# Patient Record
Sex: Female | Born: 1940 | Race: White | Hispanic: No
Health system: Southern US, Community
[De-identification: ages and names within clinical notes are randomized; demographics above are authoritative.]

## PROBLEM LIST (undated history)

## (undated) DIAGNOSIS — J309 Allergic rhinitis, unspecified: Secondary | ICD-10-CM

## (undated) DIAGNOSIS — Z8673 Personal history of transient ischemic attack (TIA), and cerebral infarction without residual deficits: Secondary | ICD-10-CM

## (undated) DIAGNOSIS — F411 Generalized anxiety disorder: Secondary | ICD-10-CM

## (undated) DIAGNOSIS — Z515 Encounter for palliative care: Secondary | ICD-10-CM

## (undated) DIAGNOSIS — Z87440 Personal history of urinary (tract) infections: Secondary | ICD-10-CM

## (undated) DIAGNOSIS — R32 Unspecified urinary incontinence: Secondary | ICD-10-CM

## (undated) DIAGNOSIS — E559 Vitamin D deficiency, unspecified: Secondary | ICD-10-CM

## (undated) DIAGNOSIS — Z8709 Personal history of other diseases of the respiratory system: Secondary | ICD-10-CM

## (undated) DIAGNOSIS — F32A Depression, unspecified: Secondary | ICD-10-CM

## (undated) DIAGNOSIS — M810 Age-related osteoporosis without current pathological fracture: Secondary | ICD-10-CM

## (undated) DIAGNOSIS — I10 Essential (primary) hypertension: Secondary | ICD-10-CM

## (undated) DIAGNOSIS — E039 Hypothyroidism, unspecified: Secondary | ICD-10-CM

## (undated) DIAGNOSIS — G2581 Restless legs syndrome: Secondary | ICD-10-CM

## (undated) DIAGNOSIS — E785 Hyperlipidemia, unspecified: Secondary | ICD-10-CM

## (undated) DIAGNOSIS — F0390 Unspecified dementia without behavioral disturbance: Secondary | ICD-10-CM

## (undated) DIAGNOSIS — F329 Major depressive disorder, single episode, unspecified: Secondary | ICD-10-CM

## (undated) DIAGNOSIS — Z66 Do not resuscitate: Secondary | ICD-10-CM

## (undated) HISTORY — DX: Do not resuscitate: Z66

## (undated) HISTORY — DX: Vitamin D deficiency, unspecified: E55.9

## (undated) HISTORY — DX: Personal history of transient ischemic attack (TIA), and cerebral infarction without residual deficits: Z86.73

## (undated) HISTORY — DX: Allergic rhinitis, unspecified: J30.9

## (undated) HISTORY — DX: Hyperlipidemia, unspecified: E78.5

## (undated) HISTORY — DX: Personal history of urinary (tract) infections: Z87.440

## (undated) HISTORY — DX: Personal history of other diseases of the respiratory system: Z87.09

## (undated) HISTORY — DX: Encounter for palliative care: Z51.5

## (undated) HISTORY — DX: Hypothyroidism, unspecified: E03.9

## (undated) HISTORY — DX: Restless legs syndrome: G25.81

## (undated) HISTORY — DX: Generalized anxiety disorder: F41.1

## (undated) HISTORY — DX: Major depressive disorder, single episode, unspecified: F32.9

## (undated) HISTORY — DX: Unspecified urinary incontinence: R32

## (undated) HISTORY — DX: Depression, unspecified: F32.A

---

## 1997-09-06 ENCOUNTER — Other Ambulatory Visit: Admission: RE | Admit: 1997-09-06 | Discharge: 1997-09-06 | Payer: Self-pay | Admitting: Obstetrics and Gynecology

## 1998-11-28 ENCOUNTER — Other Ambulatory Visit: Admission: RE | Admit: 1998-11-28 | Discharge: 1998-11-28 | Payer: Self-pay | Admitting: Obstetrics and Gynecology

## 2000-01-01 ENCOUNTER — Other Ambulatory Visit: Admission: RE | Admit: 2000-01-01 | Discharge: 2000-01-01 | Payer: Self-pay | Admitting: Obstetrics and Gynecology

## 2001-01-13 ENCOUNTER — Other Ambulatory Visit: Admission: RE | Admit: 2001-01-13 | Discharge: 2001-01-13 | Payer: Self-pay | Admitting: Obstetrics and Gynecology

## 2003-10-25 ENCOUNTER — Ambulatory Visit (HOSPITAL_COMMUNITY): Admission: RE | Admit: 2003-10-25 | Discharge: 2003-10-25 | Payer: Self-pay | Admitting: Internal Medicine

## 2005-02-06 ENCOUNTER — Emergency Department (HOSPITAL_COMMUNITY): Admission: EM | Admit: 2005-02-06 | Discharge: 2005-02-06 | Payer: Self-pay | Admitting: Emergency Medicine

## 2005-02-12 ENCOUNTER — Emergency Department (HOSPITAL_COMMUNITY): Admission: EM | Admit: 2005-02-12 | Discharge: 2005-02-12 | Payer: Self-pay | Admitting: Emergency Medicine

## 2005-02-13 ENCOUNTER — Ambulatory Visit: Admission: RE | Admit: 2005-02-13 | Discharge: 2005-02-13 | Payer: Self-pay | Admitting: Family Medicine

## 2008-05-07 HISTORY — PX: OTHER SURGICAL HISTORY: SHX169

## 2008-07-13 ENCOUNTER — Ambulatory Visit: Payer: Self-pay | Admitting: Cardiology

## 2008-07-13 ENCOUNTER — Inpatient Hospital Stay (HOSPITAL_COMMUNITY): Admission: EM | Admit: 2008-07-13 | Discharge: 2008-07-16 | Payer: Self-pay | Admitting: Emergency Medicine

## 2008-07-14 ENCOUNTER — Ambulatory Visit: Payer: Self-pay | Admitting: Vascular Surgery

## 2008-07-14 ENCOUNTER — Encounter (INDEPENDENT_AMBULATORY_CARE_PROVIDER_SITE_OTHER): Payer: Self-pay | Admitting: Internal Medicine

## 2009-03-08 ENCOUNTER — Emergency Department (HOSPITAL_COMMUNITY): Admission: EM | Admit: 2009-03-08 | Discharge: 2009-03-08 | Payer: Self-pay | Admitting: Emergency Medicine

## 2009-06-07 HISTORY — PX: OTHER SURGICAL HISTORY: SHX169

## 2010-08-17 LAB — URINE MICROSCOPIC-ADD ON

## 2010-08-17 LAB — DIFFERENTIAL
Basophils Absolute: 0.1 10*3/uL (ref 0.0–0.1)
Basophils Relative: 1 % (ref 0–1)
Eosinophils Absolute: 0.2 10*3/uL (ref 0.0–0.7)
Eosinophils Relative: 2 % (ref 0–5)
Lymphocytes Relative: 16 % (ref 12–46)
Lymphs Abs: 1.5 10*3/uL (ref 0.7–4.0)
Monocytes Absolute: 0.2 10*3/uL (ref 0.1–1.0)
Monocytes Relative: 2 % — ABNORMAL LOW (ref 3–12)
Neutro Abs: 7.8 10*3/uL — ABNORMAL HIGH (ref 1.7–7.7)
Neutrophils Relative %: 80 % — ABNORMAL HIGH (ref 43–77)

## 2010-08-17 LAB — COMPREHENSIVE METABOLIC PANEL
AST: 33 U/L (ref 0–37)
Albumin: 2 g/dL — ABNORMAL LOW (ref 3.5–5.2)
Albumin: 2 g/dL — ABNORMAL LOW (ref 3.5–5.2)
Alkaline Phosphatase: 312 U/L — ABNORMAL HIGH (ref 39–117)
BUN: 13 mg/dL (ref 6–23)
BUN: 18 mg/dL (ref 6–23)
BUN: 29 mg/dL — ABNORMAL HIGH (ref 6–23)
Calcium: 7.9 mg/dL — ABNORMAL LOW (ref 8.4–10.5)
Chloride: 106 mEq/L (ref 96–112)
Creatinine, Ser: 1.36 mg/dL — ABNORMAL HIGH (ref 0.4–1.2)
Creatinine, Ser: 1.43 mg/dL — ABNORMAL HIGH (ref 0.4–1.2)
Creatinine, Ser: 1.56 mg/dL — ABNORMAL HIGH (ref 0.4–1.2)
GFR calc Af Amer: 40 mL/min — ABNORMAL LOW (ref >60)
Glucose, Bld: 77 mg/dL (ref 70–99)
Potassium: 3.6 mEq/L (ref 3.5–5.1)
Total Bilirubin: 0.6 mg/dL (ref 0.3–1.2)
Total Protein: 4.9 g/dL — ABNORMAL LOW (ref 6.0–8.3)
Total Protein: 5.4 g/dL — ABNORMAL LOW (ref 6.0–8.3)
Total Protein: 5.5 g/dL — ABNORMAL LOW (ref 6.0–8.3)

## 2010-08-17 LAB — CBC
HCT: 30.8 % — ABNORMAL LOW (ref 36.0–46.0)
HCT: 31.5 % — ABNORMAL LOW (ref 36.0–46.0)
HCT: 35 % — ABNORMAL LOW (ref 36.0–46.0)
Hemoglobin: 10.7 g/dL — ABNORMAL LOW (ref 12.0–15.0)
Hemoglobin: 11.1 g/dL — ABNORMAL LOW (ref 12.0–15.0)
Hemoglobin: 12.2 g/dL (ref 12.0–15.0)
MCHC: 34.7 g/dL (ref 30.0–36.0)
MCHC: 34.7 g/dL (ref 30.0–36.0)
MCHC: 35.3 g/dL (ref 30.0–36.0)
MCV: 82.9 fL (ref 78.0–100.0)
MCV: 83.2 fL (ref 78.0–100.0)
Platelets: 184 10*3/uL (ref 150–400)
Platelets: 187 10*3/uL (ref 150–400)
RBC: 3.7 MIL/uL — ABNORMAL LOW (ref 3.87–5.11)
RBC: 4.22 MIL/uL (ref 3.87–5.11)
RDW: 14.6 % (ref 11.5–15.5)
RDW: 14.7 % (ref 11.5–15.5)
RDW: 14.7 % (ref 11.5–15.5)
WBC: 7.9 10*3/uL (ref 4.0–10.5)
WBC: 9.7 10*3/uL (ref 4.0–10.5)

## 2010-08-17 LAB — CARDIAC PANEL(CRET KIN+CKTOT+MB+TROPI)
CK, MB: 2.4 ng/mL (ref 0.3–4.0)
Relative Index: INVALID (ref 0.0–2.5)
Total CK: 32 U/L (ref 7–177)
Troponin I: 0.05 ng/mL (ref 0.00–0.06)

## 2010-08-17 LAB — URINALYSIS, ROUTINE W REFLEX MICROSCOPIC
Bilirubin Urine: NEGATIVE
Glucose, UA: NEGATIVE mg/dL
Ketones, ur: NEGATIVE mg/dL
Nitrite: NEGATIVE
Protein, ur: 30 mg/dL — AB
Specific Gravity, Urine: 1.025 (ref 1.005–1.030)
Urobilinogen, UA: 0.2 mg/dL (ref 0.0–1.0)
pH: 6 (ref 5.0–8.0)

## 2010-08-17 LAB — HEPATIC FUNCTION PANEL
ALT: 111 U/L — ABNORMAL HIGH (ref 0–35)
AST: 43 U/L — ABNORMAL HIGH (ref 0–37)
Albumin: 2 g/dL — ABNORMAL LOW (ref 3.5–5.2)
Alkaline Phosphatase: 351 U/L — ABNORMAL HIGH (ref 39–117)
Bilirubin, Direct: 0.2 mg/dL (ref 0.0–0.3)
Indirect Bilirubin: 0.8 mg/dL (ref 0.3–0.9)
Total Bilirubin: 1 mg/dL (ref 0.3–1.2)
Total Protein: 5.5 g/dL — ABNORMAL LOW (ref 6.0–8.3)

## 2010-08-17 LAB — VITAMIN B12: Vitamin B-12: >2000 pg/mL — ABNORMAL HIGH (ref 211–911)

## 2010-08-17 LAB — BASIC METABOLIC PANEL
BUN: 36 mg/dL — ABNORMAL HIGH (ref 6–23)
CO2: 26 mEq/L (ref 19–32)
Calcium: 8.7 mg/dL (ref 8.4–10.5)
Chloride: 105 mEq/L (ref 96–112)
Creatinine, Ser: 1.71 mg/dL — ABNORMAL HIGH (ref 0.4–1.2)
GFR calc Af Amer: 36 mL/min — ABNORMAL LOW (ref >60)
GFR calc non Af Amer: 30 mL/min — ABNORMAL LOW (ref >60)
Glucose, Bld: 84 mg/dL (ref 70–99)
Potassium: 3.7 mEq/L (ref 3.5–5.1)
Sodium: 140 mEq/L (ref 135–145)

## 2010-08-17 LAB — LIPID PANEL
Cholesterol: 236 mg/dL — ABNORMAL HIGH (ref 0–200)
HDL: <10 mg/dL — ABNORMAL LOW (ref >39)
Triglycerides: 208 mg/dL — ABNORMAL HIGH (ref <150)
VLDL: 42 mg/dL — ABNORMAL HIGH (ref 0–40)

## 2010-08-17 LAB — POCT CARDIAC MARKERS
CKMB, poc: 3.2 ng/mL (ref 1.0–8.0)
Myoglobin, poc: 238 ng/mL (ref 12–200)
Troponin i, poc: <0.05 ng/mL (ref 0.00–0.09)

## 2010-08-17 LAB — T4, FREE: Free T4: 1.04 ng/dL (ref 0.89–1.80)

## 2010-08-17 LAB — MAGNESIUM: Magnesium: 2.1 mg/dL (ref 1.5–2.5)

## 2010-08-17 LAB — RPR: RPR Ser Ql: NONREACTIVE

## 2010-08-17 LAB — FOLATE
Folate: 10.4 ng/mL
Folate: 13 ng/mL

## 2010-08-17 LAB — CK TOTAL AND CKMB (NOT AT ARMC)
CK, MB: 2.6 ng/mL (ref 0.3–4.0)
Relative Index: INVALID (ref 0.0–2.5)
Total CK: 37 U/L (ref 7–177)

## 2010-08-17 LAB — URINE CULTURE
Colony Count: 8000
Special Requests: NEGATIVE

## 2010-08-17 LAB — IRON AND TIBC
Iron: 19 ug/dL — ABNORMAL LOW (ref 42–135)
TIBC: 249 ug/dL — ABNORMAL LOW (ref 250–470)
UIBC: 230 ug/dL

## 2010-08-17 LAB — FERRITIN: Ferritin: 545 ng/mL — ABNORMAL HIGH (ref 10–291)

## 2010-08-17 LAB — BRAIN NATRIURETIC PEPTIDE: Pro B Natriuretic peptide (BNP): 188 pg/mL — ABNORMAL HIGH (ref 0.0–100.0)

## 2010-08-17 LAB — TROPONIN I: Troponin I: 0.06 ng/mL (ref 0.00–0.06)

## 2010-08-17 LAB — AMMONIA: Ammonia: 27 umol/L (ref 11–35)

## 2010-08-17 LAB — TSH: TSH: 5.481 u[IU]/mL — ABNORMAL HIGH (ref 0.350–4.500)
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
## 2010-09-19 NOTE — Discharge Summary (Signed)
Krista [name], Krista [name]               ACCOUNT NO.:  [id number]   MEDICAL RECORD NO.:  [id number]          PATIENT TYPE:  INP   LOCATION:  6742                         FACILITY:  [facility]   PHYSICIAN:  Marcellus Scott, MD     DATE OF BIRTH:  10/19/1940   DATE OF ADMISSION:  07/13/2008  DATE OF DISCHARGE:  07/16/2008                               DISCHARGE SUMMARY   PRIMARY MEDICAL DOCTOR:  Dr. Venetia Constable, [place], [place].   DISCHARGE DIAGNOSES:  1. Altered mental status/resolved.  2. Acute renal failure, improving. ? Chronic kidney disease.  3. Urinary tract infection.  4. Hypertension.  5. Newly diagnosed hypothyroid.  6. Chronic anemia.  7. Dementia.  8. Dyslipidemia.  9. Abnormal liver function tests.  10.Long QTc.   DISCHARGE MEDICATIONS:  1. Vitamin D 50,000 units p.o. weekly.  2. Alendronate 70 mg p.o. weekly.  3. Aricept 10 mg p.o. daily.  4. Simvastatin 80 mg p.o. daily.  5. Fexofenadine 180 mg p.o. daily.  6. Hydrocortisone 0.1% cream applied to itchy areas of skin daily as      needed.  7. Zoloft reduced to 50 mg p.o. at bedtime.  8. Calcium with vitamin D, 1 p.o. daily.  9. Multivitamins 1 p.o. daily.  10.Klonopin 0.5 mg p.o. bedtime p.r.n.  11.Amlodipine 10 mg p.o. daily.  12.Levothyroxine 25 mcg tablet, 1/2 tablet p.o. daily.  13.Ceftin 500 mg p.o. t.i.d. for 4 days.   DISCONTINUED MEDICATIONS:  1. Enalapril/hydrochlorothiazide.  2. 100 mg Zoloft.  3. Abilify.  4. Bupropion XL.   PROCEDURES:  1. Renal ultrasound impression:  Within normal limits.  2. CT of the head without contrast on July 13, 2008:  Atrophy and mild      small vessel disease.  No acute intracranial abnormality.  3. Chest x-ray on July 13, 2008 impression:  No evidence of acute      cardiac or pulmonary process.  4. A 2D echocardiogram summary:  Overall left ventricular systolic      function was normal.  Left ventricular ejection fraction estimated      to be 65%.  Mild concentric  left ventricular hypertrophy.  No      significant valvular abnormalities.   PERTINENT LABORATORIES:  Magnesium 1.9.  Comprehensive metabolic panel  today with BUN 13, creatinine 1.36, alkaline phosphatase 234, AST 35,  ALT 56, total protein 4.9, albumin 2, calcium 7.8, total bilirubin is  0.6.  CBC:  Hemoglobin 11.1, hematocrit 31.5, white blood cells 7.6,  platelets 225.  Anemia panel with reticulocytes 41, iron 19, total iron-  binding capacity 249, percent saturation 8, vitamin B12 greater than  2000, serum folate 13, ferritin 545.  Free T4 1.04, TSH 5.481.  RPR is  nonreactive.  Cardiac enzymes:  Not suggestive of an acute MI.  Lipid  panel with cholesterol 236, triglycerides 208, HDL less than 10, LDL not  calculated, VLDL 42.  Urine microscopy with 11-20 white blood cells per  high-power field and many bacteria.  Ammonia level 27.  BNP 188.   CONSULTATIONS:  Psychiatric, Dr. Jeanie Sewer.   HOSPITAL COURSE/PATIENT DISPOSITION:  [name] is a pleasant 70-year-  old Caucasian female patient with history of hypertension,  hyperlipidemia, retinal hemorrhages, cognitive disorder or depression,  who apparently has had waxing and waning mental status over the last  year to 1-1/2 years.  However, over the recent few months, she has been  noted to be more confused and forgetful with worsening memory.  The  patient was seen at the primary care physician's office the week prior  to admission where she was noted to have renal failure with BUN 79,  creatinine 5.03.  She also had a TSH of 7.76.  She was then admitted for  further evaluation and management.   1. Altered mental status:  This was probably secondary to delirium      from her urinary tract infection and acute renal failure      complicating underlying early dementia.  With treatment of her      renal failure and urinary tract infection, her mental status has      slowly but surely improved.  She has pleasant affect and is       oriented in person, place and time.  She still has some memory      deficits.  Psychiatry was consulted who indicated that she has      partial delirium, unspecified persistent mental disorder, not      otherwise specified, mood disorder, not otherwise specified, and      they did  not think that she was depressed.  They recommended no      driving due to low delirium threshold.  They also recommended      starting Namenda when Aricept efficacy decreases.  According to the      family, the Wellbutrin or the Zoloft has not made any significant      difference.  I spoke with Dr. Jeanie Sewer who recommended that the      Wellbutrin XL could be abruptly stopped.  He also recommended      outpatient psychiatric followup.  Social work has been consulted to      try and assist with same.  I spoke with the patient's spouse and a      friend who are at the bedside at this time and they indicate that      the patient's mental status and physical status are at baseline.      The patient has ambulated on the floor without any difficulty.      Also, the patient's mental status change may  be secondary to newly      diagnosed hypothyroidism, which is being treated.  2. Acute renal failure secondary to a combination of dehydration and      ACE inhibitors plus diuretics:  These meds were obviously held.      The patient was hydrated.  Obstructive hydronephrosis was ruled      out.  Her renal functions are progressively improving.  I wonder if      she has an element of chronic kidney disease.  Recommend ad lib      p.o. liquids and followup of her renal functions in the next 5-7      days at her primary MD's office.  3. Urinary tract infection:  The patient was placed on ceftriaxone.      Unfortunately, her urine culture sample was not sent until she had      been on the antibiotic for a couple of days.  We will change to  Ceftin and complete a total week's course.  4. Hypertension:  The patient was  placed on Norvasc and titrated, with      fair control of her hypertension.  5. New hypothyroid:  The patient has been started on low-dose      Synthroid.  Recommend repeating thyroid function tests in 4 to 6      weeks.  6. Chronic anemia:  Stable.  Consider outpatient evaluation as deemed      necessary.  7. Early dementia:  Recommendations as above.  8. Dyslipidemia:  Continue statins.  9. Abnormal LFTs, question etiology:  The patient has not complained      of any abdominal or GI symptoms.  These numbers are improving.  We      recommend outpatient followup in the next 5-7 days.  10.Long QTc where QTc was :  Her Abilify was discontinued.      Zoloft was decreased.  Wellbutrin will be discontinued today.      Recommend followup EKG at the next primary physician's visit.   The patient's care in the hospital was discussed extensively with her  daughter and health care power of attorney, [name], yesterday.  All her  questions were answered.  The patient at this time is stable for  discharge home. Also [name] indicated that she would make an appointment  with a Psychiatrist she knows.   Time spent coordinating this discharge is 40 minutes.      Marcellus Scott, MD  Electronically Signed     AH/MEDQ  D:  07/16/2008  T:  07/16/2008  Job:  [id number]   cc:   Antonietta Breach, M.D.

## 2010-09-19 NOTE — Consult Note (Signed)
NAMECARIDAD, SILVEIRA               ACCOUNT NO.:  [id number]   MEDICAL RECORD NO.:  [id number]          PATIENT TYPE:  INP   LOCATION:  6742                         FACILITY:  [facility]   PHYSICIAN:  Antonietta Breach, M.D.  DATE OF BIRTH:  1941/01/18   DATE OF CONSULTATION:  07/15/2008  DATE OF DISCHARGE:  07/16/2008                                 CONSULTATION   REQUESTING PHYSICIAN:  [facility].   REASON FOR CONSULTATION:  Depression.   HISTORY OF PRESENT ILLNESS:  Mrs. Michole Lecuyer is a 70 year old female  admitted to the [facility] on July 13, 2008, due to acute renal  failure and delirium.   Mrs. Ann Maki does have a baseline of progressively worsening memory.  She has had a flattening of her affect and a reduction of her energy.  She has been started on Wellbutrin as an outpatient without any  improvement.  Over the past week prior to admission, she was started on  Zoloft.  The Zoloft was increased to 100.  Also, recently she was  started on Abilify 2 mg daily 2 days prior to admission in order to  augment the anti-depressant.   Over the past 2 months, she has had worsening confusion.   She had been started on the Wellbutrin by neurology who initially  evaluated her.   She is not having any hallucinations or delusions.  She does maintain  intact orientation and has partial short-term memory loss during her  current hospitalization.  The worsening of her energy appears to be  associated with her general medical exacerbation.  She has had a  progressive flattening of her facial expression, however, does smile  intermittently and appropriately.   PAST PSYCHIATRIC HISTORY:  In review of the past medical record, Mrs.  Ann Maki was seen by neurology approximately 1 year ago.  They diagnosed  depression at that time and prescribed Wellbutrin.   Mrs. Ann Maki is notable for not having any insight into her impaired  memory.  She was forced to stop driving.  She was  placed on Aricept and  has had some partial improvement in her memory function.   She also has been treated with Klonopin 0.5 mg q.h.s.   FAMILY PSYCHIATRIC HISTORY:  Mrs. Laurena Slimmer father had Alzheimer  disease.   SOCIAL HISTORY:  She is widowed.  She lost her job secondary to impaired  memory.   She does not use any alcohol or illegal drugs.   Mrs. Ann Maki has remarried.  She has 3 children.   She used to work part time at [employer] but lost her job due to memory  impairment.   PAST MEDICAL HISTORY:  1. Hypertension.  2. Retinal hemorrhages.   ALLERGIES:  SULFA.   MEDICATIONS:  The MAR is reviewed.  She is on:  1. Wellbutrin 150 mg daily.  2. Aricept 10 mg daily.  3. Zoloft 50 mg daily.  4. Klonopin 0.5 mg q.h.s. p.r.n.   LABORATORY DATA:  Head CT without contrast showed atrophy and mild small  vessel disease.  There was no acute abnormality.   Sodium 137, BUN  18, creatinine 1.43, glucose 77, WBC 7.6, hemoglobin  11.1, platelet count 225.   B12, T4, RPR, folic acid, ammonia all unremarkable.   REVIEW OF SYSTEMS:  PSYCHIATRIC:  There has been no change in her  appetite.  She maintains interest in listening to music, as well as TV  and reading the paper daily.  CONSTITUTIONAL, HEAD, EYES, EARS, NOSE,  THROAT, MOUTH NEUROLOGIC, CARDIOVASCULAR, RESPIRATORY, GASTROINTESTINAL,  GENITOURINARY, SKIN, MUSCULOSKELETAL, HEMATOLOGIC, LYMPHATIC, ENDOCRINE,  METABOLIC:  All unremarkable.   EXAMINATION:  VITAL SIGNS:  Temperature 98.2.  Pulse 88.  Respiratory  rate 20.  Blood pressure 141/73.  O2 saturation on room air 92%.  GENERAL APPEARANCE:  Mrs. Ann Maki is an elderly female lying in a supine  position in her hospital bed with no abnormal involuntary movements.   MENTAL STATUS EXAM:  Mrs. Ann Maki is alert.  Her concentration is mildly  decreased.  Her affect involves mildly flat facies, however, as the  conversation proceeds she does display normal smiling appropriate to   content.  She discusses a normal mood with interest as described above.   She does have intact orientation to the year, month, day of the month,  day of the week, place, the floor she is on in the hospital, and person.   Memory, 3/3 words immediate, 2/3 words at recall.  Fund of knowledge and  intelligence are grossly within normal limits.   Speech involves normal rate, slightly flat prosody without dysarthria.   Thought process is logical, coherent, and goal directed.  No looseness  of associations.  Thought content, no thoughts of harming herself or  others, no delusions, no hallucinations.  Insight is poor for her  history of memory impairment.  Her judgment is mildly impaired.   ASSESSMENT:  AXIS I:  1. 293.00, there does appear to have been a partial delirium.  Rule      out dementia, not otherwise specified.  There are some partial      symptoms.  2. 293.83 mood disorder, not otherwise specified.  Mrs. Laurena Slimmer      decreased energy does appear to be associated with her general      medical problems.  However, an episode of major depression cannot      be ruled out given that she is currently on 2 antidepressants and      was evaluated by neurology in the past to have depression.  Most      likely for cognitive and memory dysfunction that has emerged in the      past year has been secondary to her organic central nervous system      changes and not secondary to standard functional major depression.  AXIS II:  None.  AXIS III:  See past medical history.  AXIS IV:  General medical.  AXIS V:  45.   RECOMMENDATIONS:  1. Would recommend no driving given that Mrs. Ann Maki does have a low      delirium threshold in her abilities can wax and wane.  2. When her Aricept no longer has efficacy, would try Namenda.  3. Would continue her Zoloft raised back to the previous dosage of 100      mg daily and would stop her Wellbutrin given that the dopamine      reuptake inhibition might  worsen her cognition over the long term      and she needs to be reevaluated with less psychotropic medication.  4. Given the sophistication of her medication, would have her  follow      up with a psychiatrist within the first week of discharge.  Would      also have her follow up with neurology.   FURTHER DISCUSSION:  A flattening of affect can typically be seen with  early primary dementia.  Of note, the family reports that they never saw  an improvement in any symptoms with Wellbutrin or Zoloft.  She has  continued to maintain the interests described above, however, they have  noticed the progressive flattening of her facial expression, as well as  her decreased energy and memory difficulty, they have noted the  improvement in her memory difficulty (therefore it would be best to try  reduction of her anti-depressant regimen as described above).   Regarding her QTC of 500 ms, the Wellbutrin would be more likely to  cause QTC problems given its excitatory pharmacodynamic action, however,  neither Zoloft nor Wellbutrin are known for QTC prolongation.      Antonietta Breach, M.D.  Electronically Signed     JW/MEDQ  D:  07/17/2008  T:  07/17/2008  Job:  [id number]

## 2011-11-05 HISTORY — PX: OTHER SURGICAL HISTORY: SHX169

## 2012-09-08 LAB — COMPREHENSIVE METABOLIC PANEL
AST: 24 U/L
Albumin: 3.6
Alkaline Phosphatase: 78 U/L
Creat: 1
Glucose: 93 mg/dL
Total Bilirubin: 0.4 mg/dL

## 2012-09-08 LAB — IRON AND TIBC
Iron: 54
UIBC: 206

## 2012-09-08 LAB — CBC
HGB: 13.9 g/dL
WBC: 4.7
platelet count: 151

## 2012-09-08 LAB — LIPID PANEL
Cholesterol: 168 mg/dL (ref 0–200)
HDL: 44 mg/dL (ref 35–70)
Triglycerides: 128

## 2012-09-08 LAB — FERRITIN: Ferritin: 151.5

## 2012-09-08 LAB — TSH: TSH: 2.079

## 2012-10-09 ENCOUNTER — Emergency Department (HOSPITAL_COMMUNITY): Payer: Medicare Other

## 2012-10-09 ENCOUNTER — Encounter (HOSPITAL_COMMUNITY): Payer: Self-pay | Admitting: *Deleted

## 2012-10-09 ENCOUNTER — Inpatient Hospital Stay (HOSPITAL_COMMUNITY)
Admission: EM | Admit: 2012-10-09 | Discharge: 2012-10-10 | DRG: 689 | Disposition: A | Discharge status: Home Health | Payer: Medicare Other | Attending: Internal Medicine | Admitting: Internal Medicine

## 2012-10-09 DIAGNOSIS — E870 Hyperosmolality and hypernatremia: Secondary | ICD-10-CM | POA: Diagnosis present

## 2012-10-09 DIAGNOSIS — I10 Essential (primary) hypertension: Secondary | ICD-10-CM | POA: Diagnosis present

## 2012-10-09 DIAGNOSIS — G9349 Other encephalopathy: Secondary | ICD-10-CM | POA: Diagnosis present

## 2012-10-09 DIAGNOSIS — E039 Hypothyroidism, unspecified: Secondary | ICD-10-CM | POA: Diagnosis present

## 2012-10-09 DIAGNOSIS — F0390 Unspecified dementia without behavioral disturbance: Secondary | ICD-10-CM | POA: Diagnosis present

## 2012-10-09 DIAGNOSIS — E876 Hypokalemia: Secondary | ICD-10-CM

## 2012-10-09 DIAGNOSIS — I129 Hypertensive chronic kidney disease with stage 1 through stage 4 chronic kidney disease, or unspecified chronic kidney disease: Secondary | ICD-10-CM | POA: Diagnosis present

## 2012-10-09 DIAGNOSIS — G9340 Encephalopathy, unspecified: Secondary | ICD-10-CM | POA: Diagnosis present

## 2012-10-09 DIAGNOSIS — R4182 Altered mental status, unspecified: Secondary | ICD-10-CM

## 2012-10-09 DIAGNOSIS — N182 Chronic kidney disease, stage 2 (mild): Secondary | ICD-10-CM | POA: Diagnosis present

## 2012-10-09 DIAGNOSIS — N183 Chronic kidney disease, stage 3 unspecified: Secondary | ICD-10-CM | POA: Diagnosis present

## 2012-10-09 DIAGNOSIS — E871 Hypo-osmolality and hyponatremia: Secondary | ICD-10-CM | POA: Diagnosis present

## 2012-10-09 DIAGNOSIS — R197 Diarrhea, unspecified: Secondary | ICD-10-CM | POA: Diagnosis present

## 2012-10-09 DIAGNOSIS — N390 Urinary tract infection, site not specified: Secondary | ICD-10-CM | POA: Diagnosis present

## 2012-10-09 HISTORY — DX: Age-related osteoporosis without current pathological fracture: M81.0

## 2012-10-09 HISTORY — DX: Essential (primary) hypertension: I10

## 2012-10-09 HISTORY — DX: Unspecified dementia, unspecified severity, without behavioral disturbance, psychotic disturbance, mood disturbance, and anxiety: F03.90

## 2012-10-09 LAB — CBC WITH DIFFERENTIAL/PLATELET
Basophils Absolute: 0 10*3/uL (ref 0.0–0.1)
Eosinophils Relative: 0 % (ref 0–5)
Lymphocytes Relative: 26 % (ref 12–46)
Neutro Abs: 3.6 10*3/uL (ref 1.7–7.7)
Platelets: 166 10*3/uL (ref 150–400)
RDW: 14.1 % (ref 11.5–15.5)
WBC: 5.6 10*3/uL (ref 4.0–10.5)

## 2012-10-09 LAB — TSH: TSH: 0.967 u[IU]/mL (ref 0.350–4.500)

## 2012-10-09 LAB — APTT: aPTT: 26 seconds (ref 24–37)

## 2012-10-09 LAB — GLUCOSE, CAPILLARY
Glucose-Capillary: 93 mg/dL (ref 70–99)
Glucose-Capillary: 82 mg/dL (ref 70–99)

## 2012-10-09 LAB — URINALYSIS, ROUTINE W REFLEX MICROSCOPIC
Glucose, UA: NEGATIVE mg/dL
Hgb urine dipstick: NEGATIVE
Ketones, ur: NEGATIVE mg/dL
Nitrite: POSITIVE — AB
Protein, ur: 30 mg/dL — AB
Specific Gravity, Urine: 1.04 — ABNORMAL HIGH (ref 1.005–1.030)
Urobilinogen, UA: 0.2 mg/dL (ref 0.0–1.0)
pH: 6 (ref 5.0–8.0)

## 2012-10-09 LAB — COMPREHENSIVE METABOLIC PANEL
ALT: 13 U/L (ref 0–35)
AST: 20 U/L (ref 0–37)
Albumin: 2.6 g/dL — ABNORMAL LOW (ref 3.5–5.2)
Alkaline Phosphatase: 77 U/L (ref 39–117)
BUN: 26 mg/dL — ABNORMAL HIGH (ref 6–23)
CO2: 32 mEq/L (ref 19–32)
CO2: 29 mEq/L (ref 19–32)
Calcium: 8.8 mg/dL (ref 8.4–10.5)
Calcium: 8.3 mg/dL — ABNORMAL LOW (ref 8.4–10.5)
Chloride: 118 mEq/L — ABNORMAL HIGH (ref 96–112)
Creatinine, Ser: 1.16 mg/dL — ABNORMAL HIGH (ref 0.50–1.10)
Creatinine, Ser: 0.98 mg/dL (ref 0.50–1.10)
GFR calc Af Amer: 53 mL/min — ABNORMAL LOW (ref >90)
GFR calc Af Amer: 65 mL/min — ABNORMAL LOW (ref >90)
GFR calc non Af Amer: 46 mL/min — ABNORMAL LOW (ref >90)
GFR calc non Af Amer: 56 mL/min — ABNORMAL LOW (ref >90)
Glucose, Bld: 109 mg/dL — ABNORMAL HIGH (ref 70–99)
Glucose, Bld: 104 mg/dL — ABNORMAL HIGH (ref 70–99)
Potassium: 2.8 mEq/L — ABNORMAL LOW (ref 3.5–5.1)
Sodium: 156 mEq/L — ABNORMAL HIGH (ref 135–145)
Total Bilirubin: 0.4 mg/dL (ref 0.3–1.2)
Total Protein: 6.1 g/dL (ref 6.0–8.3)

## 2012-10-09 LAB — CBC
HCT: 43.5 % (ref 36.0–46.0)
Hemoglobin: 14 g/dL (ref 12.0–15.0)
MCH: 28.5 pg (ref 26.0–34.0)
MCHC: 32.2 g/dL (ref 30.0–36.0)
MCV: 88.4 fL (ref 78.0–100.0)
Platelets: 209 10*3/uL (ref 150–400)
RBC: 4.92 MIL/uL (ref 3.87–5.11)
RDW: 14 % (ref 11.5–15.5)
WBC: 6.4 10*3/uL (ref 4.0–10.5)

## 2012-10-09 LAB — BASIC METABOLIC PANEL
BUN: 18 mg/dL (ref 6–23)
Calcium: 8.3 mg/dL — ABNORMAL LOW (ref 8.4–10.5)
Chloride: 109 mEq/L (ref 96–112)
Creatinine, Ser: 0.95 mg/dL (ref 0.50–1.10)
GFR calc Af Amer: 69 mL/min — ABNORMAL LOW (ref >90)
GFR calc Af Amer: 68 mL/min — ABNORMAL LOW (ref >90)
GFR calc non Af Amer: 59 mL/min — ABNORMAL LOW (ref >90)
GFR calc non Af Amer: 58 mL/min — ABNORMAL LOW (ref >90)
Potassium: 3.6 mEq/L (ref 3.5–5.1)
Potassium: 3.4 mEq/L — ABNORMAL LOW (ref 3.5–5.1)
Sodium: 150 mEq/L — ABNORMAL HIGH (ref 135–145)

## 2012-10-09 LAB — URINE MICROSCOPIC-ADD ON

## 2012-10-09 LAB — PHOSPHORUS: Phosphorus: 3.1 mg/dL (ref 2.3–4.6)

## 2012-10-09 LAB — CG4 I-STAT (LACTIC ACID): Lactic Acid, Venous: 1.56 mmol/L (ref 0.5–2.2)

## 2012-10-09 LAB — PROTIME-INR: INR: 1.05 (ref 0.00–1.49)

## 2012-10-09 MED ORDER — FREE WATER
250.0000 mL | Freq: Four times a day (QID) | Status: DC
  Administered 2012-10-09 – 2012-10-10 (×4): 250 mL via ORAL

## 2012-10-09 MED ORDER — FREE WATER
250.0000 mL | Freq: Four times a day (QID) | Status: DC
Start: 2012-10-09 — End: 2012-10-09

## 2012-10-09 MED ORDER — POTASSIUM CHLORIDE IN NACL 20-0.9 MEQ/L-% IV SOLN
Freq: Once | INTRAVENOUS | Status: AC
  Administered 2012-10-09: 03:00:00 via INTRAVENOUS
  Filled 2012-10-09: qty 1000

## 2012-10-09 MED ORDER — ONDANSETRON HCL 4 MG/2ML IJ SOLN: 4.0000 mg | Freq: Four times a day (QID) | INTRAMUSCULAR | Status: DC | PRN

## 2012-10-09 MED ORDER — HYDROCODONE-ACETAMINOPHEN 5-325 MG PO TABS: 1.0000 | ORAL_TABLET | ORAL | Status: DC | PRN

## 2012-10-09 MED ORDER — DIVALPROEX SODIUM 125 MG PO CPSP
250.0000 mg | ORAL_CAPSULE | Freq: Three times a day (TID) | ORAL | Status: DC
  Administered 2012-10-09 – 2012-10-10 (×5): 250 mg via ORAL
  Filled 2012-10-09 (×6): qty 2

## 2012-10-09 MED ORDER — ACETAMINOPHEN 650 MG RE SUPP: 650.0000 mg | Freq: Four times a day (QID) | RECTAL | Status: DC | PRN

## 2012-10-09 MED ORDER — LORAZEPAM 2 MG/ML IJ SOLN
1.0000 mg | Freq: Once | INTRAMUSCULAR | Status: AC
  Administered 2012-10-09: 1 mg via INTRAVENOUS
  Filled 2012-10-09: qty 1

## 2012-10-09 MED ORDER — ENSURE COMPLETE PO LIQD
237.0000 mL | Freq: Two times a day (BID) | ORAL | Status: DC
  Administered 2012-10-09 – 2012-10-10 (×2): 237 mL via ORAL

## 2012-10-09 MED ORDER — FREE WATER: 250.0000 mL | Freq: Four times a day (QID) | Status: DC

## 2012-10-09 MED ORDER — SODIUM CHLORIDE 0.9 % IV SOLN
INTRAVENOUS | Status: DC
  Administered 2012-10-09: 75 mL/h via INTRAVENOUS

## 2012-10-09 MED ORDER — MORPHINE SULFATE 2 MG/ML IJ SOLN: 1.0000 mg | INTRAMUSCULAR | Status: DC | PRN

## 2012-10-09 MED ORDER — ONDANSETRON HCL 4 MG PO TABS: 4.0000 mg | ORAL_TABLET | Freq: Four times a day (QID) | ORAL | Status: DC | PRN

## 2012-10-09 MED ORDER — POTASSIUM CHLORIDE CRYS ER 20 MEQ PO TBCR
40.0000 meq | EXTENDED_RELEASE_TABLET | Freq: Once | ORAL | Status: AC
  Administered 2012-10-09: 40 meq via ORAL
  Filled 2012-10-09: qty 2

## 2012-10-09 MED ORDER — SODIUM CHLORIDE 0.9 % IJ SOLN
3.0000 mL | Freq: Two times a day (BID) | INTRAMUSCULAR | Status: DC
  Administered 2012-10-09 – 2012-10-10 (×3): 3 mL via INTRAVENOUS

## 2012-10-09 MED ORDER — LEVOTHYROXINE SODIUM 25 MCG PO TABS
25.0000 ug | ORAL_TABLET | Freq: Every day | ORAL | Status: DC
  Administered 2012-10-09 – 2012-10-10 (×2): 25 ug via ORAL
  Filled 2012-10-09 (×3): qty 1

## 2012-10-09 MED ORDER — SERTRALINE HCL 50 MG PO TABS
50.0000 mg | ORAL_TABLET | Freq: Every day | ORAL | Status: DC
  Administered 2012-10-09 – 2012-10-10 (×2): 50 mg via ORAL
  Filled 2012-10-09 (×2): qty 1

## 2012-10-09 MED ORDER — ADULT MULTIVITAMIN W/MINERALS CH
1.0000 | ORAL_TABLET | Freq: Every day | ORAL | Status: DC
  Administered 2012-10-09 – 2012-10-10 (×2): 1 via ORAL
  Filled 2012-10-09 (×2): qty 1

## 2012-10-09 MED ORDER — DEXTROSE 5 % IV SOLN
1.0000 g | INTRAVENOUS | Status: DC
  Administered 2012-10-10: 1 g via INTRAVENOUS
  Filled 2012-10-09: qty 10

## 2012-10-09 MED ORDER — POTASSIUM CHLORIDE 10 MEQ/100ML IV SOLN
10.0000 meq | INTRAVENOUS | Status: AC
  Administered 2012-10-09 (×3): 10 meq via INTRAVENOUS
  Filled 2012-10-09 (×3): qty 100

## 2012-10-09 MED ORDER — MEMANTINE HCL ER 28 MG PO CP24: 28.0000 mg | ORAL_CAPSULE | Freq: Every day | ORAL | Status: DC

## 2012-10-09 MED ORDER — MEMANTINE HCL 10 MG PO TABS
10.0000 mg | ORAL_TABLET | Freq: Two times a day (BID) | ORAL | Status: DC
  Administered 2012-10-09 – 2012-10-10 (×2): 10 mg via ORAL
  Filled 2012-10-09 (×4): qty 1

## 2012-10-09 MED ORDER — ACETAMINOPHEN 325 MG PO TABS: 650.0000 mg | ORAL_TABLET | Freq: Four times a day (QID) | ORAL | Status: DC | PRN

## 2012-10-09 MED ORDER — DEXTROSE 5 % IV SOLN
INTRAVENOUS | Status: DC
  Administered 2012-10-09 – 2012-10-10 (×4): via INTRAVENOUS

## 2012-10-09 MED ORDER — PRAMIPEXOLE DIHYDROCHLORIDE 0.25 MG PO TABS
0.2500 mg | ORAL_TABLET | Freq: Every day | ORAL | Status: DC
  Administered 2012-10-09: 0.25 mg via ORAL
  Filled 2012-10-09 (×2): qty 1

## 2012-10-09 MED ORDER — CEFTRIAXONE SODIUM 1 G IJ SOLR
1.0000 g | Freq: Once | INTRAMUSCULAR | Status: AC
  Administered 2012-10-09: 1 g via INTRAVENOUS
  Filled 2012-10-09: qty 10

## 2012-10-09 NOTE — Progress Notes (Signed)
INITIAL NUTRITION ASSESSMENT  DOCUMENTATION CODES Per approved criteria  -Not Applicable   INTERVENTION: Provide Ensure Complete po BID, each supplement provides 350 kcal and 13 grams of protein. Provide Magic Cup once daily Provide Multivitamin with minerals daily   NUTRITION DIAGNOSIS: Inadequate oral intake related to acute encephalopathy as evidenced by pt ate no breakfast and 5% of lunch.   Goal: Pt to meet >/= 90% of their estimated nutrition needs  Monitor:  PO intake Weight Labs  Reason for Assessment: Malnutrition Screening Tool, Score of 5  72 y.o. female  Admitting Dx: Acute encephalopathy  ASSESSMENT: 72 year old female with past medical history of dementia, hypertension, hypothyroidism who was brought [facility] ED for evaluation of worsening mental status changes, weakness, poor oral intake for past couple of days prior to this admission. Patient apparently also had diarrhea for past few days. Pt is nonverbal and no family present in room. Pt was NPO for breakfast and ate 5% of lunch per nursing notes. Performed NFPE; signs of mild wasting.   Height: Ht Readings from Last 1 Encounters:  10/09/12 5\' 3"  (1.6 m)    Weight: Wt Readings from Last 1 Encounters:  10/09/12 115 lb 15.4 oz (52.6 kg)    Ideal Body Weight: 115 lbs  % Ideal Body Weight: 100%  Wt Readings from Last 10 Encounters:  10/09/12 115 lb 15.4 oz (52.6 kg)    Usual Body Weight: unknown  % Usual Body Weight: NA  BMI:  Body mass index is 20.55 kg/(m^2).  Estimated Nutritional Needs: Kcal: 1330-1550 Protein: 53-63 grams Fluid: 1.6 L  Skin: dry, flaky, intact  Nutrition Focused Physical Exam:  Subcutaneous Fat:  Orbital Region: wnl Upper Arm Region: mild wasting Thoracic and Lumbar Region: NA  Muscle:  Temple Region: wnl Clavicle Bone Region: mild wasting Clavicle and Acromion Bone Region: mild wasting Scapular Bone Region: mild wasting Dorsal Hand: NA Patellar Region: mild  wasting Anterior Thigh Region: mild wasting Posterior Calf Region: NA  Edema: none  Diet Order: Dysphagia  EDUCATION NEEDS: -No education needs identified at this time   Intake/Output Summary (Last 24 hours) at 10/09/12 1316 Last data filed at 10/09/12 1300  Gross per 24 hour  Intake    303 ml  Output      0 ml  Net    303 ml    Last BM: 6/4  Labs:   Recent Labs Lab 10/09/12 0130 10/09/12 0525 10/09/12 1155  NA 156* 156* 150*  K 2.8* 3.3* 3.6  CL 118* 122* 116*  CO2 32 29 29  BUN 26* 23 18  CREATININE 1.16* 0.98 0.94  CALCIUM 8.8 8.3* 8.3*  MG  --  2.1  --   PHOS  --  3.1  --   GLUCOSE 109* 104* 95    CBG (last 3)   Recent Labs  10/09/12 0117 10/09/12 0735  GLUCAP 93 82    Scheduled Meds: . cefTRIAXone (ROCEPHIN)  IV  1 g Intravenous Q24H  . divalproex  250 mg Oral TID  . free water  250 mL Oral Q6H  . levothyroxine  25 mcg Oral QAC breakfast  . Memantine HCl ER  28 mg Oral QHS  . potassium chloride  40 mEq Oral Once  . pramipexole  0.25 mg Oral QHS  . sertraline  50 mg Oral Daily  . sodium chloride  3 mL Intravenous Q12H    Continuous Infusions: . dextrose 125 mL/hr at 10/09/12 0802    Past Medical  History  Diagnosis Date  . Hypertension   . Thyroid disease   . Hypercholesteremia   . Osteoporosis   . Dementia     History reviewed. No pertinent past surgical history.  Ian Malkin RD, LDN Inpatient Clinical Dietitian Pager: [phone] After Hours Pager: [phone]

## 2012-10-09 NOTE — Progress Notes (Addendum)
[facility] PROGRESS NOTE  Krista [name] YNW:[id number] DOB: 1941-03-26 DOA: 10/09/2012 PCP: Eustaquio Boyden, MD  Brief narrative 72 year old female with past medical history of dementia, hypertension, hypothyroidism who was brought to [facility] ED for evaluation of worsening mental status changes, weakness, poor oral intake for few  days prior to this admission.  Assessment/Plan: Acute encephalopathy associated with UTI, dehydration and hypernatremia CT head unremarkable. Seems at baseline this morning per daughter Follow cx  Hypernatremia  secondary to poor po intake. Water deficit calculated to be 2.7 Litres. Will correct with D5 W at 100 cc/hr Encourage po intake continue free water Monitor BMET Q6-8 hrs. Monitor I/O  UTI  follow urine cx continue rocephin  Hypokalemia  replenish with kcl  Dementia At baseline is non verbal and mumbles few words   continue namenda  Hypothyroidism continue synthroid  Code Status: DNR Family Communication: Called daughter with update Disposition Plan: home once stable   Consultants:  none  Procedures:  none  Antibiotics:  IV rocephin  HPI/Subjective: Patient appears more alert and oriented this am. Per daughter she is at baseline  Objective: Filed Vitals:   10/09/12 0115 10/09/12 0133 10/09/12 0323 10/09/12 0444  BP: 99/62  108/51 128/69  Pulse: 83   74  Temp: 100.2 F (37.9 C)   97.8 F (36.6 C)  TempSrc: Rectal   Axillary  Resp:  18 13 16   Height:    5\' 3"  (1.6 m)  Weight:    52.6 kg (115 lb 15.4 oz)  SpO2: 93%  94% 97%    Intake/Output Summary (Last 24 hours) at 10/09/12 1047 Last data filed at 10/09/12 0900  Gross per 24 hour  Intake    200 ml  Output      0 ml  Net    200 ml   Filed Weights   10/09/12 0444  Weight: 52.6 kg (115 lb 15.4 oz)    Exam:   General:  Elderly female in NAD  HEENT: no pallor, dry mucosa  Cardiovascular: N S1&S2, no murmurs  Respiratory: clear breath sounds b/l, no  added sounds  Abdomen: soft, NT, ND, BS+  Musculoskeletal: warm, no edema  CNS: Alert and awake responds to few commands. Severely demented   Data Reviewed: Basic Metabolic Panel:  Recent Labs Lab 10/09/12 0130 10/09/12 0525  NA 156* 156*  K 2.8* 3.3*  CL 118* 122*  CO2 32 29  GLUCOSE 109* 104*  BUN 26* 23  CREATININE 1.16* 0.98  CALCIUM 8.8 8.3*  MG  --  2.1  PHOS  --  3.1   Liver Function Tests:  Recent Labs Lab 10/09/12 0130 10/09/12 0525  AST 20 21  ALT 13 13  ALKPHOS 77 72  BILITOT 0.4 0.2*  PROT 6.1 5.5*  ALBUMIN 2.6* 2.3*   No results found for this basename: LIPASE, AMYLASE,  in the last 168 hours No results found for this basename: AMMONIA,  in the last 168 hours CBC:  Recent Labs Lab 10/09/12 0130 10/09/12 0525  WBC 6.4 5.6  NEUTROABS  --  3.6  HGB 14.0 12.4  HCT 43.5 40.1  MCV 88.4 88.1  PLT 209 166   Cardiac Enzymes: No results found for this basename: CKTOTAL, CKMB, CKMBINDEX, TROPONINI,  in the last 168 hours BNP (last 3 results) No results found for this basename: PROBNP,  in the last 8760 hours CBG:  Recent Labs Lab 10/09/12 0117 10/09/12 0735  GLUCAP 93 82    No results found  for this or any previous visit (from the past 240 hour(s)).   Studies: Ct Head Wo Contrast  10/09/2012   *RADIOLOGY REPORT*  Clinical Data: Altered mental status; worsening lethargy for 1 week.  CT HEAD WITHOUT CONTRAST  Technique:  Contiguous axial images were obtained from the base of the skull through the vertex without contrast.  Comparison: CT of the head performed 07/13/2008  Findings: There is no evidence of acute infarction, mass lesion, or intra- or extra-axial hemorrhage on CT.  Prominence of the ventricles and sulci reflects moderate cortical volume loss.  Chronic lacunar infarcts are seen within the right basal ganglia.  Scattered periventricular white matter change likely reflects small vessel ischemic microangiopathy.  The brainstem and fourth  ventricle are within normal limits.  The cerebral hemispheres demonstrate grossly normal gray-white differentiation.  No mass effect or midline shift is seen.  There is no evidence of fracture; visualized osseous structures are unremarkable in appearance.  The orbits are within normal limits. The paranasal sinuses and mastoid air cells are well-aerated.  No significant soft tissue abnormalities are seen.  IMPRESSION:  1.  No acute intracranial pathology seen on CT. 2.  Moderate cortical volume loss and scattered small vessel ischemic microangiopathy. 3.  Chronic lacunar infarcts within the right basal ganglia.   Original Report Authenticated By: Tonia Ghent, M.D.   Dg Chest Portable 1 View  10/09/2012   *RADIOLOGY REPORT*  Clinical Data: Altered mental status.  PORTABLE CHEST - 1 VIEW  Comparison: Chest radiograph performed 07/13/2008  Findings: The lungs are mildly hypoexpanded.  Mild vascular congestion is noted.  There is no evidence of focal opacification, pleural effusion or pneumothorax.  The cardiomediastinal silhouette is borderline normal in size.  No acute osseous abnormalities are seen.  Evaluation is suboptimal due to patient rotation.  IMPRESSION: Mild vascular congestion noted; lungs mildly hypoexpanded but grossly clear.  Evaluation suboptimal due to patient rotation.   Original Report Authenticated By: Tonia Ghent, M.D.    Scheduled Meds: . cefTRIAXone (ROCEPHIN)  IV  1 g Intravenous Q24H  . divalproex  250 mg Oral TID  . free water  250 mL Oral Q6H  . levothyroxine  25 mcg Oral QAC breakfast  . Memantine HCl ER  28 mg Oral QHS  . potassium chloride  40 mEq Oral Once  . pramipexole  0.25 mg Oral QHS  . sertraline  50 mg Oral Daily  . sodium chloride  3 mL Intravenous Q12H   Continuous Infusions: . dextrose 125 mL/hr at 10/09/12 0802      Time spent:    Eddie North  [facility] Pager [phone]. If 7PM-7AM, please contact night-coverage at [email or web address],  password [facility] 10/09/2012, 10:47 AM  LOS: 0 days

## 2012-10-09 NOTE — ED Notes (Signed)
Per EMS report: pt from home: family reports pt has been having increased lethargy for the past week.  Pt hx of dementia.  Baseline for pt is a/o x 3.  Family denies any reports of fall. Pt has had a decreased in appetite and intake for the past week. EMS stroke screen negative.  EMS CBG: 117, EMS VS: BP: 118/64, HR: 85, RR: 22.  Pt observed to be alert but not vocal.

## 2012-10-09 NOTE — Progress Notes (Signed)
CARE MANAGEMENT NOTE 10/09/2012  Patient:  [facility]   Account Number:  [id number]  Date Initiated:  10/09/2012  Documentation initiated by:  DAVIS,RHONDA  Subjective/Objective Assessment:   pt with confusion, hypernatremia, fever     Action/Plan:   lives at home with children   Anticipated DC Date:  10/12/2012   Anticipated DC Plan:  HOME/SELF CARE  In-house referral  Clinical Social Worker      DC Planning Services  CM consult      [facility] Choice  NA   Choice offered to / List presented to:  NA   DME arranged  NA      DME agency  NA     HH arranged  NA      HH agency  NA   Status of service:  In process, will continue to follow Medicare Important Message given?  NA - LOS <3 / Initial given by admissions (If response is "NO", the following Medicare IM given date fields will be blank) Date Medicare IM given:   Date Additional Medicare IM given:    Discharge Disposition:    Per UR Regulation:  Reviewed for med. necessity/level of care/duration of stay  If discussed at Long Length of Stay Meetings, dates discussed:    Comments:  [id number]/Rhonda Earlene Plater, RN, BSN, CCM: CHART REVIEWED AND UPDATED.  Next chart review due on [id number]. NO DISCHARGE NEEDS PRESENT AT THIS TIME. CASE MANAGEMENT [phone]

## 2012-10-09 NOTE — H&P (Signed)
[facility] History and Physical  Krista [name] OZH:[id number] DOB: 1941-02-13 DOA: 10/09/2012  Referring physician: ER physician PCP: Eustaquio Boyden, MD   Chief Complaint: mental status changes  HPI:  72 year old female with past medical history of dementia, hypertension, hypothyroidism who was brought to [facility] ED for evaluation of worsening mental status changes, weakness, poor oral intake for past couple of days prior to this admission. Patient is not a good historian at this time due to her dementia and family not at the bedside at the moment to provide details of HPI. Patient apparently also had diarrhea for past few days. No complaints of abdominal pain or vomiting. No reports of fever at home. No shortness of breath. No reports of blood in stool or urine. In ED, BP was 99/62, HR 83, Tmax = 100.63F and O2 saturation of 93% while breathing ambient air. CT head showed no acute intracranial findings. CXR showed some vascular congestion but suboptimal study due to patient's body roatation. Patient was found to have potassium of 2.8 which was repleted in ED. Sodium was 156 and creatinine was 1.16 which is better than baseline of 1.3 in 2010.  Assessment and Plan:  Principal Problem:   Acute encephalopathy - likely worsening dementia and UT all contributory. CT head with no acute intracranial findings. - follow up results of urine and blood cultures - started rocephin for UTI - continue normal saline - PT/OT evaluation  Active Problems:   UTI (lower urinary tract infection) - rocephin Q 24 hours - follow up urine culture results   CKD, stage 3 - creatinine in 2010 1.3 and on this admission 1.16 - continue IV fluids - follow up BMP in am   Diarrhea - possible gastroenteritis - check stool for C.diff   Dementia - continue memantine    Hypertension - hold norvasc due to borderline low BP, 99/62   Hyperatremia - secondary to dehydration - continue normal saine @ 75 cc/hr for next  12 hours - follow up BMP in am   Hypokalemia - secondary to GI losses - repleted in ED - follow up BMP in am   Hypothyroidism - continue synthroid - check TSH level  Code Status: Full Family Communication: Pt at bedside Disposition Plan: Admit for further evaluation  Manson Passey, MD  [facility] Pager [phone]  If 7PM-7AM, please contact night-coverage [email or web address] Password [email or web address] 10/09/2012, 3:12 AM   Review of Systems:  Unable to obtain due to history of dementia  Past Medical History  Diagnosis Date  . Hypertension   . Thyroid disease   . Hypercholesteremia   . Osteoporosis   . Dementia    History reviewed. No pertinent past surgical history. Social History:  reports that she has never smoked. She does not have any smokeless tobacco history on file. She reports that she does not drink alcohol or use illicit drugs.  Allergies  Allergen Reactions  . Sulfa Antibiotics Other (See Comments)    Unknown reaction per pt's daughters    Family History: unable to obtain due to history of dementia  Prior to Admission medications   Medication Sig Start Date End Date Taking? Authorizing Provider  amLODipine (NORVASC) 10 MG tablet Take 10 mg by mouth daily.   Yes Historical Provider, MD  Calcium Carbonate-Vitamin D (CALCIUM 600 + D PO) Take 1 tablet by mouth daily.   Yes Historical Provider, MD  Dietary Management Product (AXONA) packet Take 40 g by mouth at bedtime.   Yes Historical Provider, MD  divalproex (DEPAKOTE SPRINKLE) 125 MG capsule Take 250 mg by mouth 3 (three) times daily.   Yes Historical Provider, MD  levothyroxine (SYNTHROID, LEVOTHROID) 25 MCG tablet Take 25 mcg by mouth daily before breakfast.   Yes Historical Provider, MD  Memantine HCl ER (NAMENDA XR) 28 MG CP24 Take 28 mg by mouth at bedtime.   Yes Historical Provider, MD  pramipexole (MIRAPEX) 0.25 MG tablet Take 0.25 mg by mouth at bedtime.   Yes Historical Provider, MD  sertraline (ZOLOFT) 50 MG tablet Take 50  mg by mouth daily.   Yes Historical Provider, MD   Physical Exam: Filed Vitals:   10/09/12 0115 10/09/12 0133  BP: 99/62   Pulse: 83   Temp: 100.2 F (37.9 C)   TempSrc: Rectal   Resp:  18  SpO2: 93%     Physical Exam  Constitutional: Appears frail. No distress.  HENT: Normocephalic. Dry mucus membranes; no tonsillar erythema or exudates Eyes: Conjunctivae are normal. PERRLA, no scleral icterus.  Neck: Neck supple. No JVD. No tracheal deviation. No thyromegaly.  CVS: RRR, S1/S2 +, no murmurs, no gallops, no carotid bruit.  Pulmonary: Effort and breath sounds normal, no stridor, rhonchi, wheezes, rales.  Abdominal: Soft. BS +,  no distension, tenderness, rebound or guarding.  Musculoskeletal: . No edema and no tenderness.  Lymphadenopathy: No lymphadenopathy noted, cervical, inguinal. Neuro: non verbal no focal neurologic deficits; does not follow commands but able to lift her extremities  Skin: Skin is warm and dry. No rash noted. Not diaphoretic. No erythema. No pallor.   Labs on Admission:  Basic Metabolic Panel:  Recent Labs Lab 10/09/12 0130  NA 156*  K 2.8*  CL 118*  CO2 32  GLUCOSE 109*  BUN 26*  CREATININE 1.16*  CALCIUM 8.8   Liver Function Tests:  Recent Labs Lab 10/09/12 0130  AST 20  ALT 13  ALKPHOS 77  BILITOT 0.4  PROT 6.1  ALBUMIN 2.6*   No results found for this basename: LIPASE, AMYLASE,  in the last 168 hours No results found for this basename: AMMONIA,  in the last 168 hours CBC:  Recent Labs Lab 10/09/12 0130  WBC 6.4  HGB 14.0  HCT 43.5  MCV 88.4  PLT 209   Cardiac Enzymes: No results found for this basename: CKTOTAL, CKMB, CKMBINDEX, TROPONINI,  in the last 168 hours BNP: No components found with this basename: POCBNP,  CBG:  Recent Labs Lab 10/09/12 0117  GLUCAP 93    Radiological Exams on Admission: Ct Head Wo Contrast  10/09/2012   *RADIOLOGY REPORT*  Clinical Data: Altered mental status; worsening lethargy for 1  week.  CT HEAD WITHOUT CONTRAST  Technique:  Contiguous axial images were obtained from the base of the skull through the vertex without contrast.  Comparison: CT of the head performed 07/13/2008  Findings: There is no evidence of acute infarction, mass lesion, or intra- or extra-axial hemorrhage on CT.  Prominence of the ventricles and sulci reflects moderate cortical volume loss.  Chronic lacunar infarcts are seen within the right basal ganglia.  Scattered periventricular white matter change likely reflects small vessel ischemic microangiopathy.  The brainstem and fourth ventricle are within normal limits.  The cerebral hemispheres demonstrate grossly normal gray-white differentiation.  No mass effect or midline shift is seen.  There is no evidence of fracture; visualized osseous structures are unremarkable in appearance.  The orbits are within normal limits. The paranasal sinuses and mastoid air cells are well-aerated.  No significant soft  tissue abnormalities are seen.  IMPRESSION:  1.  No acute intracranial pathology seen on CT. 2.  Moderate cortical volume loss and scattered small vessel ischemic microangiopathy. 3.  Chronic lacunar infarcts within the right basal ganglia.   Original Report Authenticated By: Tonia Ghent, M.D.   Dg Chest Portable 1 View  10/09/2012   *RADIOLOGY REPORT*  Clinical Data: Altered mental status.  PORTABLE CHEST - 1 VIEW  Comparison: Chest radiograph performed 07/13/2008  Findings: The lungs are mildly hypoexpanded.  Mild vascular congestion is noted.  There is no evidence of focal opacification, pleural effusion or pneumothorax.  The cardiomediastinal silhouette is borderline normal in size.  No acute osseous abnormalities are seen.  Evaluation is suboptimal due to patient rotation.  IMPRESSION: Mild vascular congestion noted; lungs mildly hypoexpanded but grossly clear.  Evaluation suboptimal due to patient rotation.   Original Report Authenticated By: Tonia Ghent, M.D.     EKG: Normal sinus rhythm, no ST/T wave changes  Time spent: 75 minutes

## 2012-10-09 NOTE — ED Notes (Signed)
ZOX:[id number]<AV> Expected date:<BR> Expected time:<BR> Means of arrival:<BR> Comments:<BR> EMS/altered LOC/hx dementia

## 2012-10-09 NOTE — ED Provider Notes (Signed)
History    72 year old female brought in by family for evaluation of decreased mental status. Patient has advanced dementia but for the past week she has been progressively more weak/fatigued than she normally is. She seems to be slumping to her right side when in the seated position.  She has been essentially nonverbal for the past week. Her mental status at baseline waxes and wanes. She does have some periods of more lucidity where she is verbal, although confused. Has not complained of anything recently although family does not feel she would even if something was bothering her. No recent falls that they're aware of. No recent medication changes. Decreased appetite for the past several days. No vomiting. Has been having 3-4 diarrheal stools per day for the past several days as well. No recent antibiotic usage. No blood or melena.   CSN: [id number]  Arrival date & time 10/09/12  0108   First MD Initiated Contact with Patient 10/09/12 0117      Chief Complaint  Patient presents with  . Altered Mental Status    (Consider location/radiation/quality/duration/timing/severity/associated sxs/prior treatment) HPI  Past Medical History  Diagnosis Date  . Hypertension   . Thyroid disease   . Hypercholesteremia   . Osteoporosis   . Dementia     History reviewed. No pertinent past surgical history.  No family history on file.  History  Substance Use Topics  . Smoking status: Never Smoker   . Smokeless tobacco: Not on file  . Alcohol Use: No    OB History   Grav Para Term Preterm Abortions TAB SAB Ect Mult Living                  Review of Systems  Level 5 caveat applies because pt is nonverbal.    Allergies  Sulfa antibiotics  Home Medications  No current outpatient prescriptions on file.  BP 99/62  Pulse 83  Temp(Src) 100.2 F (37.9 C) (Rectal)  Resp 18  SpO2 93%  Physical Exam  Nursing note and vitals reviewed. Constitutional: She appears well-developed and  well-nourished. No distress.  Laying in bed. Frail appearing but NAD.   HENT:  Head: Normocephalic and atraumatic.  Eyes: Conjunctivae are normal. Pupils are equal, round, and reactive to light. Right eye exhibits no discharge. Left eye exhibits no discharge.  Neck: Neck supple.  Cardiovascular: Normal rate, regular rhythm and normal heart sounds.  Exam reveals no gallop and no friction rub.   No murmur heard. Pulmonary/Chest: Effort normal and breath sounds normal. No respiratory distress.  Abdominal: Soft. She exhibits no distension. There is no tenderness.  Musculoskeletal: She exhibits no edema and no tenderness.  Neurological: She is alert.  Laying in bed with eyes open. Leaning to R side. Does not appear to have facial droop. Conjugate gaze. PERRL. Not following commands. Strength 5/5 b/l upper extremities. Pt with great grip strength and will squeeze hands when something placed in then, although not necessarily on command. Tone seems normal lower extremities, but pt not noted to be moving either significantly during my assessments. Per daughters chronically weak in LE.   Skin: Skin is warm and dry.    ED Course  Procedures (including critical care time)  Labs Reviewed  COMPREHENSIVE METABOLIC PANEL - Abnormal; Notable for the following:    Sodium 156 (*)    Potassium 2.8 (*)    Chloride 118 (*)    Glucose, Bld 109 (*)    BUN 26 (*)    Creatinine, Ser 1.16 (*)  Albumin 2.6 (*)    GFR calc non Af Amer 46 (*)    GFR calc Af Amer 53 (*)    All other components within normal limits  URINALYSIS, ROUTINE W REFLEX MICROSCOPIC - Abnormal; Notable for the following:    Color, Urine AMBER (*)    APPearance CLOUDY (*)    Specific Gravity, Urine 1.040 (*)    Bilirubin Urine SMALL (*)    Protein, ur 30 (*)    Nitrite POSITIVE (*)    Leukocytes, UA TRACE (*)    All other components within normal limits  URINE MICROSCOPIC-ADD ON - Abnormal; Notable for the following:    Squamous  Epithelial / LPF MANY (*)    Bacteria, UA MANY (*)    Casts HYALINE CASTS (*)    All other components within normal limits  URINE CULTURE  CBC  GLUCOSE, CAPILLARY  CG4 I-STAT (LACTIC ACID)   Ct Head Wo Contrast  10/09/2012   *RADIOLOGY REPORT*  Clinical Data: Altered mental status; worsening lethargy for 1 week.  CT HEAD WITHOUT CONTRAST  Technique:  Contiguous axial images were obtained from the base of the skull through the vertex without contrast.  Comparison: CT of the head performed 07/13/2008  Findings: There is no evidence of acute infarction, mass lesion, or intra- or extra-axial hemorrhage on CT.  Prominence of the ventricles and sulci reflects moderate cortical volume loss.  Chronic lacunar infarcts are seen within the right basal ganglia.  Scattered periventricular white matter change likely reflects small vessel ischemic microangiopathy.  The brainstem and fourth ventricle are within normal limits.  The cerebral hemispheres demonstrate grossly normal gray-white differentiation.  No mass effect or midline shift is seen.  There is no evidence of fracture; visualized osseous structures are unremarkable in appearance.  The orbits are within normal limits. The paranasal sinuses and mastoid air cells are well-aerated.  No significant soft tissue abnormalities are seen.  IMPRESSION:  1.  No acute intracranial pathology seen on CT. 2.  Moderate cortical volume loss and scattered small vessel ischemic microangiopathy. 3.  Chronic lacunar infarcts within the right basal ganglia.   Original Report Authenticated By: Tonia Ghent, M.D.   Dg Chest Portable 1 View  10/09/2012   *RADIOLOGY REPORT*  Clinical Data: Altered mental status.  PORTABLE CHEST - 1 VIEW  Comparison: Chest radiograph performed 07/13/2008  Findings: The lungs are mildly hypoexpanded.  Mild vascular congestion is noted.  There is no evidence of focal opacification, pleural effusion or pneumothorax.  The cardiomediastinal silhouette is  borderline normal in size.  No acute osseous abnormalities are seen.  Evaluation is suboptimal due to patient rotation.  IMPRESSION: Mild vascular congestion noted; lungs mildly hypoexpanded but grossly clear.  Evaluation suboptimal due to patient rotation.   Original Report Authenticated By: Tonia Ghent, M.D.     1. UTI (urinary tract infection)   2. Change in mental status   3. Hypokalemia   4. Hypernatremia       MDM  71yF with change in her mental status. Most likely 2/2 UTI. Abx. Hypernatremia and hypokalemia.  With hx of diarrhea for past week, most likely dehydration and hypokalemia from GI loss. Consider hypoaldosteronism. No hypertension though. Pt lives at home. Discussed IV hydration/lyte repletion and outpt abx versus admission. Would prefer admission which I feel is reasonable. Will discuss with hospitalist.         Raeford Razor, MD 10/09/12 0300

## 2012-10-09 NOTE — ED Notes (Signed)
Notified RN, Merle, CBG 93.

## 2012-10-10 DIAGNOSIS — N390 Urinary tract infection, site not specified: Principal | ICD-10-CM

## 2012-10-10 DIAGNOSIS — G9340 Encephalopathy, unspecified: Secondary | ICD-10-CM

## 2012-10-10 DIAGNOSIS — E870 Hyperosmolality and hypernatremia: Secondary | ICD-10-CM

## 2012-10-10 DIAGNOSIS — E876 Hypokalemia: Secondary | ICD-10-CM

## 2012-10-10 LAB — GLUCOSE, CAPILLARY: Glucose-Capillary: 66 mg/dL — ABNORMAL LOW (ref 70–99)

## 2012-10-10 LAB — BASIC METABOLIC PANEL
BUN: 12 mg/dL (ref 6–23)
BUN: 11 mg/dL (ref 6–23)
CO2: 29 mEq/L (ref 19–32)
Calcium: 7.6 mg/dL — ABNORMAL LOW (ref 8.4–10.5)
Calcium: 7.7 mg/dL — ABNORMAL LOW (ref 8.4–10.5)
Calcium: 8.3 mg/dL — ABNORMAL LOW (ref 8.4–10.5)
Creatinine, Ser: 0.76 mg/dL (ref 0.50–1.10)
Creatinine, Ser: 0.8 mg/dL (ref 0.50–1.10)
GFR calc Af Amer: 76 mL/min — ABNORMAL LOW (ref >90)
GFR calc Af Amer: 83 mL/min — ABNORMAL LOW (ref >90)
GFR calc Af Amer: >90 mL/min (ref >90)
GFR calc non Af Amer: 66 mL/min — ABNORMAL LOW (ref >90)
GFR calc non Af Amer: 72 mL/min — ABNORMAL LOW (ref >90)
GFR calc non Af Amer: 82 mL/min — ABNORMAL LOW (ref >90)
Potassium: 3.4 mEq/L — ABNORMAL LOW (ref 3.5–5.1)
Sodium: 138 mEq/L (ref 135–145)

## 2012-10-10 MED ORDER — CIPROFLOXACIN HCL 500 MG PO TABS: 500.0000 mg | ORAL_TABLET | Freq: Two times a day (BID) | ORAL | Status: DC

## 2012-10-10 MED ORDER — ENSURE COMPLETE PO LIQD: 237.0000 mL | Freq: Two times a day (BID) | ORAL | Status: DC

## 2012-10-10 NOTE — Discharge Summary (Signed)
Physician Discharge Summary  Krista [name] HQI:[id number] DOB: 10/31/1940 DOA: 10/09/2012  PCP: Eustaquio Boyden, MD  Admit date: 10/09/2012 Discharge date: 10/10/2012  Time spent: 40  minutes  Recommendations for Outpatient Follow-up:  Follow up with PCP in 1 week. Follow urine cx results  Discharge Diagnoses:   Principal Problem:   Acute encephalopathy  Active Problems:   UTI (lower urinary tract infection)   Hypernatremia   Dementia   Hypokalemia   Hypothyroidism   HTN (hypertension)   Diarrhea   CKD (chronic kidney disease), stage III   Discharge Condition:fair  Diet recommendation: dys level 3  Filed Weights   10/09/12 0444 10/10/12 0528  Weight: 52.6 kg (115 lb 15.4 oz) 56.4 kg (124 lb 5.4 oz)    History of present illness:  Please refer to admission H&P for details, but in brief 72 year old female with past medical history of dementia, hypertension, hypothyroidism who was brought to [facility] ED for evaluation of worsening mental status changes, weakness, poor oral intake for few days prior to this admission. Patient noted to be dehydrated, hypernatremic with Na of 156 and UTI.    Hospital Course:  Acute encephalopathy  associated with UTI, dehydration and hypernatremia  CT head unremarkable.  Now at baseline per daughter Seen by nutrition consult and recommended ensure supplements.  Seen by PT and recommends HH with 24 hr supervision. Daughter says she is able to provide this.    Hypernatremia  secondary to poor po intake. Water deficit calculated to be 2.7 Litres. Corrected with D5W. Na improved to 138 this am. Given free water as well. Daughter instructed to encourage patient for oral intake and plenty of fluids.  UTI  On IV rocephin. Urine cx growing >100k colonies. Follow cx as outpt. Will discharge on total 7 days of abx with po ciprofloxacin.  Hypokalemia  replenished   Dementia  At baseline which is non verbal and mumbles few words  continue namenda    Hypothyroidism  continue synthroid   Patient clinically stable. Na corrected and appears hydrated. Stable for discharge home with HHPT.  Code Status: DNR  Family Communication: daughter at bedisde Disposition Plan: home with [facility] PT      Procedures:  none  Consultations:  None  Discharge Exam: Filed Vitals:   10/09/12 2145 10/09/12 2222 10/10/12 0520 10/10/12 0528  BP: 96/52 116/64 135/53   Pulse: 54 66 58   Temp: 97.6 F (36.4 C)  97.2 F (36.2 C)   TempSrc: Axillary  Axillary   Resp: 16  14   Height:      Weight:    56.4 kg (124 lb 5.4 oz)  SpO2: 95%  96%     General: Elderly female in NAD  HEENT: no pallor, moist mucosa  Cardiovascular: N S1&S2, no murmurs  Respiratory: clear breath sounds b/l, no added sounds  Abdomen: soft, NT, ND, BS+  Musculoskeletal: warm, no edema  CNS: Alert and awake responds to few commands. Severely demented    Discharge Instructions   Future Appointments Provider Department Dept Phone   10/17/2012 10:30 AM Eustaquio Boyden, MD [facility] [phone]       Medication List    TAKE these medications       amLODipine 10 MG tablet  Commonly known as:  NORVASC  Take 10 mg by mouth daily.     AXONA packet  Take 40 g by mouth at bedtime.     CALCIUM 600 + D PO  Take  1 tablet by mouth daily.     ciprofloxacin 500 MG tablet  Commonly known as:  CIPRO  Take 1 tablet (500 mg total) by mouth 2 (two) times daily. For 5 days     divalproex 125 MG capsule  Commonly known as:  DEPAKOTE SPRINKLE  Take 250 mg by mouth 3 (three) times daily.     feeding supplement Liqd  Take 237 mLs by mouth 2 (two) times daily between meals.     levothyroxine 25 MCG tablet  Commonly known as:  SYNTHROID, LEVOTHROID  Take 25 mcg by mouth daily before breakfast.     NAMENDA XR 28 MG Cp24  Generic drug:  Memantine HCl ER  Take 28 mg by mouth at bedtime.     pramipexole 0.25 MG tablet  Commonly known as:  MIRAPEX   Take 0.25 mg by mouth at bedtime.     sertraline 50 MG tablet  Commonly known as:  ZOLOFT  Take 50 mg by mouth daily.       Allergies  Allergen Reactions  . Sulfa Antibiotics Other (See Comments)    Unknown reaction per pt's daughters       Follow-up Information   Follow up with Eustaquio Boyden, MD In 1 week.   Contact information:   [address] [place] [address] [phone]        The results of significant diagnostics from this hospitalization (including imaging, microbiology, ancillary and laboratory) are listed below for reference.    Significant Diagnostic Studies: Ct Head Wo Contrast  10/09/2012   *RADIOLOGY REPORT*  Clinical Data: Altered mental status; worsening lethargy for 1 week.  CT HEAD WITHOUT CONTRAST  Technique:  Contiguous axial images were obtained from the base of the skull through the vertex without contrast.  Comparison: CT of the head performed 07/13/2008  Findings: There is no evidence of acute infarction, mass lesion, or intra- or extra-axial hemorrhage on CT.  Prominence of the ventricles and sulci reflects moderate cortical volume loss.  Chronic lacunar infarcts are seen within the right basal ganglia.  Scattered periventricular white matter change likely reflects small vessel ischemic microangiopathy.  The brainstem and fourth ventricle are within normal limits.  The cerebral hemispheres demonstrate grossly normal gray-white differentiation.  No mass effect or midline shift is seen.  There is no evidence of fracture; visualized osseous structures are unremarkable in appearance.  The orbits are within normal limits. The paranasal sinuses and mastoid air cells are well-aerated.  No significant soft tissue abnormalities are seen.  IMPRESSION:  1.  No acute intracranial pathology seen on CT. 2.  Moderate cortical volume loss and scattered small vessel ischemic microangiopathy. 3.  Chronic lacunar infarcts within the right basal ganglia.   Original  Report Authenticated By: Tonia Ghent, M.D.   Dg Chest Portable 1 View  10/09/2012   *RADIOLOGY REPORT*  Clinical Data: Altered mental status.  PORTABLE CHEST - 1 VIEW  Comparison: Chest radiograph performed 07/13/2008  Findings: The lungs are mildly hypoexpanded.  Mild vascular congestion is noted.  There is no evidence of focal opacification, pleural effusion or pneumothorax.  The cardiomediastinal silhouette is borderline normal in size.  No acute osseous abnormalities are seen.  Evaluation is suboptimal due to patient rotation.  IMPRESSION: Mild vascular congestion noted; lungs mildly hypoexpanded but grossly clear.  Evaluation suboptimal due to patient rotation.   Original Report Authenticated By: Tonia Ghent, M.D.    Microbiology: Recent Results (from the past 240 hour(s))  URINE CULTURE  Status: None   Collection Time    10/09/12  1:48 AM      Result Value Range Status   Specimen Description URINE, CATHETERIZED   Final   Special Requests NONE   Final   Culture  Setup Time 10/09/2012 08:38   Final   Colony Count >=100,000 COLONIES/ML   Final   Culture GRAM NEGATIVE RODS   Final   Report Status PENDING   Incomplete  CLOSTRIDIUM DIFFICILE BY PCR     Status: None   Collection Time    10/10/12 12:54 AM      Result Value Range Status   C difficile by pcr NEGATIVE  NEGATIVE Final     Labs: Basic Metabolic Panel:  Recent Labs Lab 10/09/12 0130 10/09/12 0525 10/09/12 1155 10/09/12 1741 10/10/12 0005 10/10/12 0626  NA 156* 156* 150* 144 138 138  K 2.8* 3.3* 3.6 3.4* 3.4* 3.4*  CL 118* 122* 116* 109 105 105  CO2 32 29 29 29 29 27   GLUCOSE 109* 104* 95 112* 89 77  BUN 26* 23 18 17 16 12   CREATININE 1.16* 0.98 0.94 0.95 0.86 0.76  CALCIUM 8.8 8.3* 8.3* 8.1* 7.6* 7.7*  MG  --  2.1  --   --   --   --   PHOS  --  3.1  --   --   --   --    Liver Function Tests:  Recent Labs Lab 10/09/12 0130 10/09/12 0525  AST 20 21  ALT 13 13  ALKPHOS 77 72  BILITOT 0.4 0.2*  PROT  6.1 5.5*  ALBUMIN 2.6* 2.3*   No results found for this basename: LIPASE, AMYLASE,  in the last 168 hours No results found for this basename: AMMONIA,  in the last 168 hours CBC:  Recent Labs Lab 10/09/12 0130 10/09/12 0525  WBC 6.4 5.6  NEUTROABS  --  3.6  HGB 14.0 12.4  HCT 43.5 40.1  MCV 88.4 88.1  PLT 209 166   Cardiac Enzymes: No results found for this basename: CKTOTAL, CKMB, CKMBINDEX, TROPONINI,  in the last 168 hours BNP: BNP (last 3 results) No results found for this basename: PROBNP,  in the last 8760 hours CBG:  Recent Labs Lab 10/09/12 0117 10/09/12 0735 10/10/12 0742 10/10/12 0857  GLUCAP 93 82 66* 75       Signed:  Jeslie Lowe  [facility] 10/10/2012, 12:26 PM

## 2012-10-10 NOTE — Progress Notes (Signed)
Hypoglycemic Event  CBG: 66  Treatment: 15 GM carbohydrate snack (breakfast)  Symptoms: None  Follow-up CBG: Time:0910 CBG Result:75  Possible Reasons for Event: Unknown  Comments/MD notified:will notify on rounding    Krista [name]  Remember to initiate Hypoglycemia Order Set & complete

## 2012-10-10 NOTE — Evaluation (Signed)
Physical Therapy Evaluation Patient Details Name: Krista [name] MRN: [id number] DOB: Sep 02, 1940 Today's Date: 10/10/2012 Time: 8413-2440 PT Time Calculation (min): 40 min  PT Assessment / Plan / Recommendation Clinical Impression  Pt presents with acute encephalopathy and UTI.  Note pt has history of dementia, however per daughter is now at baseline.  Tolerated OOB to and from 3in1, however requires +2 total assist (pt unable to assist at all due to pt very rigid and resistant to movement) during eval.  Pts daughter states she is mostly total assist at home, but was able to ambulate some.  Pt will benefit from skilled PT in acute venue to address deficits.  PT spoke with daughter regarding D/C plans, and daughter wishes to take pt home at D/C, therefore recommend HHPT (more for home assessment and safety with equipment) with hospital bed and w/c/cushion.      PT Assessment  Patient needs continued PT services    Follow Up Recommendations  Home health PT;Supervision/Assistance - 24 hour    Does the patient have the potential to tolerate intense rehabilitation      Barriers to Discharge None      Equipment Recommendations  Hospital bed;Wheelchair (measurements PT);Wheelchair cushion (measurements PT) (daughter to let HHPT decide if lift appropriate)    Recommendations for Other Services     Frequency Min 2X/week    Precautions / Restrictions Precautions Precautions: Fall Precaution Comments: pt with advanced dementia Restrictions Weight Bearing Restrictions: No   Pertinent Vitals/Pain No stated pain, 4/10 per PAINAD      Mobility  Bed Mobility Bed Mobility: Supine to Sit;Sit to Supine Supine to Sit: 1: +2 Total assist Supine to Sit: Patient Percentage: 0% Sit to Supine: 1: +2 Total assist Sit to Supine: Patient Percentage: 0% Details for Bed Mobility Assistance: Pt very resistant to most movement and would grasp bed handrail at times.  Provided manual cuing and assist to guide  pt to EOB, however required total assist to get to EOB completely, as well as to lie back down.  Transfers Transfers: Sit to Stand;Stand to Sit;Squat Pivot Transfers Sit to Stand: 1: +2 Total assist;From bed;From chair/3-in-1 Sit to Stand: Patient Percentage: 0% Stand to Sit: 1: +2 Total assist;To chair/3-in-1;To bed Stand to Sit: Patient Percentage: 0% Squat Pivot Transfers: 1: +2 Total assist Squat Pivot Transfers: Patient Percentage: 0% Details for Transfer Assistance: Pt unable to provide any assist for standing or transfer.  Noted that when pt was sitting at EOB, she was soiled, therefore assisted her via squat pivot to bedside commode, however therapist and tech had to provide manual cues for flexing hips to sit down completely.   Ambulation/Gait Ambulation/Gait Assistance: 1: +2 Total assist    Exercises     PT Diagnosis: Difficulty walking;Generalized weakness  PT Problem List: Decreased activity tolerance;Decreased balance;Decreased mobility;Decreased coordination;Decreased cognition;Decreased strength PT Treatment Interventions: Gait training;Functional mobility training;Therapeutic activities;Therapeutic exercise;Balance training;Patient/family education   PT Goals Acute Rehab PT Goals PT Goal Formulation: With family Time For Goal Achievement: 10/17/12 Potential to Achieve Goals: Fair Pt will go Supine/Side to Sit: with mod assist PT Goal: Supine/Side to Sit - Progress: Goal set today Pt will go Sit to Supine/Side: with mod assist PT Goal: Sit to Supine/Side - Progress: Goal set today Pt will go Sit to Stand: with max assist PT Goal: Sit to Stand - Progress: Goal set today Pt will go Stand to Sit: with max assist PT Goal: Stand to Sit - Progress: Goal set today Pt will Transfer  Bed to Chair/Chair to Bed: with max assist PT Transfer Goal: Bed to Chair/Chair to Bed - Progress: Goal set today  Visit Information  Last PT Received On: 10/10/12 Assistance Needed: +2     Subjective Data  Subjective: Pt mostly nonverbal, but would have intermittent vocalizations Patient Stated Goal: per daughter, to keep pt at home as long as possible.    Prior Functioning  Home Living Lives With: Daughter Available Help at Discharge: Family;Available 24 hours/day Type of Home: House Home Access: Stairs to enter [employer] of Steps: 3 Entrance Stairs-Rails: None Home Layout: One level Bathroom Shower/Tub:  (sponge bathes (daughter does for her)) Bathroom Toilet: Standard Home Adaptive Equipment: None Prior Function Level of Independence: Needs assistance Needs Assistance: Bathing;Dressing;Feeding;Grooming;Toileting;Meal Prep;Light Housekeeping;Gait Bath: Total Dressing: Total Feeding: Total Grooming: Total Toileting: Total Meal Prep: Total Light Housekeeping: Total Able to Take Stairs?: Yes Comments: Pts daughter was helping with all ADL's at home and someone is always with her when walking and that she requires increased assist for ambulation     Cognition  Cognition Arousal/Alertness: Awake/alert Behavior During Therapy: Restless Overall Cognitive Status: History of cognitive impairments - at baseline    Extremity/Trunk Assessment Right Lower Extremity Assessment RLE ROM/Strength/Tone: Unable to fully assess;Due to impaired cognition Left Lower Extremity Assessment LLE ROM/Strength/Tone: Unable to fully assess;Due to impaired cognition Trunk Assessment Trunk Assessment: Kyphotic   Balance Balance Balance Assessed: Yes Static Sitting Balance Static Sitting - Balance Support: Bilateral upper extremity supported Static Sitting - Level of Assistance: 5: Stand by assistance Static Sitting - Comment/# of Minutes: Pt able to sit at bedside commode at stand by assist with BUE support on arm rests.   End of Session PT - End of Session Equipment Utilized During Treatment: Gait belt Activity Tolerance: Treatment limited secondary to  agitation Patient left: in bed;with call bell/phone within reach;with bed alarm set;with family/visitor present Nurse Communication: Mobility status;Need for lift equipment  GP     Vista Deck 10/10/2012, 12:11 PM

## 2012-10-11 LAB — URINE CULTURE: Colony Count: >=100000

## 2012-10-11 NOTE — Care Management Note (Signed)
    Page 1 of 2   10/11/2012     4:19:47 PM   CARE MANAGEMENT NOTE 10/11/2012  Patient:  [facility]   Account Number:  [id number]  Date Initiated:  10/09/2012  Documentation initiated by:  DAVIS,RHONDA  Subjective/Objective Assessment:   pt with confusion, hypernatremia, fever     Action/Plan:   lives at home with children   Anticipated DC Date:  10/10/2012   Anticipated DC Plan:  HOME W HOME HEALTH SERVICES  In-house referral  Clinical Social Worker      DC Planning Services  CM consult      [facility] Choice  NA   Choice offered to / List presented to:  C-4 Adult Children   DME arranged  NA      DME agency  NA     HH arranged  HH-2 PT      HH agency  [facility]   Status of service:  Completed, signed off Medicare Important Message given?  NA - LOS <3 / Initial given by admissions (If response is "NO", the following Medicare IM given date fields will be blank) Date Medicare IM given:   Date Additional Medicare IM given:    Discharge Disposition:  HOME W HOME HEALTH SERVICES  Per UR Regulation:  Reviewed for med. necessity/level of care/duration of stay  If discussed at Long Length of Stay Meetings, dates discussed:    Comments:  10/11/12 4P  Zach Tietje RN,BSN NCM WEEKEND [phone] RECEIVED CALL FROM 4W-KIMBERLY ABOUT A CALL FROM D[facility]KIM TEL#[phone] ABOUT HH,& DME.LOOKED IN EPIC NOTED HHPT ORDER,& F2F ONLY.PT-RECOMMENDED HOSPITAL BED,& W/C,BUT WHEN SPOKE TO DR.DHUNGEL-HE FELTPATIENT ONLY NEEDED HHPT,& NO MEDICAL NEED FOR HOSPITAL BED,& W/C W/;ADVANCED DEMENTIA.SPOKE TO DTR-KIM & INFORMED HER OFDR.DHUNGEL'S ECISION TO ONLY WANT HHPT WHICH HE ORDERED,& DID NOT FEEL SHE HAD A MEDICAL NEED FOR HOSPITALBED,& W/C-SINCE W/ADVANCED DEMENTIA,& SHE COULD F/U W/THE PCP-DR. GUTIERREZ WHICH SHE HAS APPT ON FRIDAY 10/17/12 ABOUT THE DME.SHE VOICED UNDERSTANDING,& WASIN AGREEMENT.TC [facility]& INFORMED OFONLY HHPT,THEY ALREADYHAD ALL INFO THEY NEEDED,& WILL CONTACT  DTR FOR START OF CARE. [id number] Earlene Plater, RN, BSN, CCM: CHART REVIEWED AND UPDATED.  Next chart review due on [id number]. NO DISCHARGE NEEDS PRESENT AT THIS TIME. CASE MANAGEMENT [phone]

## 2012-10-12 ENCOUNTER — Telehealth: Payer: Self-pay | Admitting: Family Medicine

## 2012-10-12 NOTE — Telephone Encounter (Signed)
Noted. Will talk with [name] re: her mom. Recent hospitalization with acute encephalopathy presumed 2/2 UTI and hypernatremia from poor PO intake. Noted UCx >100k Enterobacter aerogenes - pt discharged on cipro 7d course.

## 2012-10-15 ENCOUNTER — Encounter: Payer: Self-pay | Admitting: Family Medicine

## 2012-10-15 ENCOUNTER — Telehealth: Payer: Self-pay | Admitting: *Deleted

## 2012-10-15 NOTE — Telephone Encounter (Signed)
Tresa Endo PT with [facility] called and did assessment on patient today after hospital d/c. She was asking for verbal order for OT and social worker eval to come to residence to address some concerns (swallowing, feeding, respite). She is aware that you don't see the patient until Friday, so if you need to wait until then, that will be fine.

## 2012-10-15 NOTE — Telephone Encounter (Signed)
ok to do verbal order. Thanks.

## 2012-10-16 NOTE — Telephone Encounter (Signed)
Message left advising Tresa Endo. Instructed to call back with any questions/concerns.

## 2012-10-17 ENCOUNTER — Ambulatory Visit (INDEPENDENT_AMBULATORY_CARE_PROVIDER_SITE_OTHER): Payer: Medicare Other | Admitting: Family Medicine

## 2012-10-17 ENCOUNTER — Encounter: Payer: Self-pay | Admitting: *Deleted

## 2012-10-17 VITALS — BP 108/70 | HR 84 | Temp 98.2°F

## 2012-10-17 DIAGNOSIS — F0390 Unspecified dementia without behavioral disturbance: Secondary | ICD-10-CM

## 2012-10-17 DIAGNOSIS — M810 Age-related osteoporosis without current pathological fracture: Secondary | ICD-10-CM

## 2012-10-17 DIAGNOSIS — G2581 Restless legs syndrome: Secondary | ICD-10-CM

## 2012-10-17 DIAGNOSIS — I10 Essential (primary) hypertension: Secondary | ICD-10-CM

## 2012-10-17 DIAGNOSIS — F329 Major depressive disorder, single episode, unspecified: Secondary | ICD-10-CM

## 2012-10-17 DIAGNOSIS — L22 Diaper dermatitis: Secondary | ICD-10-CM

## 2012-10-17 DIAGNOSIS — F32A Depression, unspecified: Secondary | ICD-10-CM | POA: Insufficient documentation

## 2012-10-17 DIAGNOSIS — R1310 Dysphagia, unspecified: Secondary | ICD-10-CM

## 2012-10-17 DIAGNOSIS — E559 Vitamin D deficiency, unspecified: Secondary | ICD-10-CM | POA: Insufficient documentation

## 2012-10-17 DIAGNOSIS — E46 Unspecified protein-calorie malnutrition: Secondary | ICD-10-CM

## 2012-10-17 DIAGNOSIS — N390 Urinary tract infection, site not specified: Secondary | ICD-10-CM

## 2012-10-17 DIAGNOSIS — B3749 Other urogenital candidiasis: Secondary | ICD-10-CM

## 2012-10-17 DIAGNOSIS — W06XXXA Fall from bed, initial encounter: Secondary | ICD-10-CM

## 2012-10-17 DIAGNOSIS — R197 Diarrhea, unspecified: Secondary | ICD-10-CM

## 2012-10-17 DIAGNOSIS — R32 Unspecified urinary incontinence: Secondary | ICD-10-CM

## 2012-10-17 DIAGNOSIS — R269 Unspecified abnormalities of gait and mobility: Secondary | ICD-10-CM

## 2012-10-17 DIAGNOSIS — F411 Generalized anxiety disorder: Secondary | ICD-10-CM

## 2012-10-17 DIAGNOSIS — R2689 Other abnormalities of gait and mobility: Secondary | ICD-10-CM

## 2012-10-17 DIAGNOSIS — E039 Hypothyroidism, unspecified: Secondary | ICD-10-CM

## 2012-10-17 DIAGNOSIS — G4700 Insomnia, unspecified: Secondary | ICD-10-CM

## 2012-10-17 MED ORDER — TRAZODONE HCL 50 MG PO TABS: 25.0000 mg | ORAL_TABLET | Freq: Every evening | ORAL | Status: DC | PRN

## 2012-10-17 NOTE — Patient Instructions (Addendum)
Decrease amlodipine to 5mg  daily (1/2 tablet). Let's try ensure as much as able (twice daily), take 30 min prior to meals. Let's decrease klonopin to 1 pill twice daily. Sign release for records from Dr. Donell Beers today. Try trazodone 25-50mg  nightly for sleep. Script for wheelchair and hospital bed provided today. Return to see me in 4-6 weeks for follow up.

## 2012-10-17 NOTE — Progress Notes (Signed)
Subjective:    Patient ID: Krista [name], female    DOB: 18-Feb-1941, 72 y.o.   MRN: [id number]  HPI CC: new pt to establish  Presents with daughter Bjorn Loser and aid Fleet Contras.  Mrs. Krista [name] presents today to establish care as well as as hospital f/u for recent hospitalization with [facility] where she was found to have UTI >100k Enterobacter species sensitive to cipro, dehydration and hypernatremia, treated with IV CTX in hospital ,and then completed 7d course of cipro.  Daughter notes she's been less spry since discharge.    She seems to have good appetite/hungry and thirsty, but has trouble with oral intake of fluid and solids, and daughter has noticed trouble with swallowing.  Drinking ensure well.  Occasionally chokes on food. Soft diet - oatmeal, grits, soft pasta, mashed potatoes.  Significant trouble with meat.  All pills are crushed.  Continued trouble with oral intake over the last several months.  Solids > liquids.  Would like script for wheelchair and for hospital bed - nonambulatory at baseline.  Not using her own bed because she will fall out of bed.  Staying on couch with her son in room who watches her while she sleeps.  Ambulation has significantly deteriorated over last 6 months.  Since discharge, has HHPT involved.  Ultimate goal of daughter is to keep mom at home as long as able.  HTN - blood pressure at home has been very stable even when she doesn't take amlodipine. Dementia diagnosed 07/2008, prior on aricept and namenda. No known h/o strokes, but found chronic lacunar infarct on CT head recent hospitalization.  Daughter noticed that 6-8 mo ago started favoring left side. Saw neurologist in 2010 - not since.  No GI doc in past. Sees psychiatrist Dr. Donell Beers - daughter not satisfied with treatment.  On zoloft 50mg  daily, klonopin 1 in am and 2 at night, depakote 2 pills bid,   Hospital nutritionist - recommended ensure supplementation daily Has stopped drinking axona, has stopped taking  namenda - due to cost of meds.  These were stopped 1 month ago.  No noted changes off meds.   Stays resistant to activities like dressing.  Never combative or aggressive or really agitated.  Does not resist feedings. Complete dependence in ADLs.  Weight - stable. This has been her baseline weight.  Daughter states at baseline around 120lbs. Bowels - Bowel incontinence progressively worse over the last 6 months.  2-3 week h/o looser stools.  C diff negative in hospital.   Bladder - incontinent at baseline.  Wears depens.  Lives with daughter Krista [name]), son, son in law, step grandson Nurse aide - [name] helps once a week. 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 Edu: some college  HCPOA: [name] (daughter).  i've asked them to bring living will information for chart.  No advanced directive in chart.  Admit date: 10/09/2012  Discharge date: 10/10/2012  Time spent: 40 minutes  Recommendations for Outpatient Follow-up:  Follow up with PCP in 1 week. Follow urine cx results  Discharge Diagnoses:  Principal Problem:  Acute encephalopathy  Active Problems:  UTI (lower urinary tract infection)  Hypernatremia  Dementia  Hypokalemia  Hypothyroidism  HTN (hypertension)  Diarrhea  CKD (chronic kidney disease), stage III  Ct Head Wo Contrast  10/09/2012 *RADIOLOGY REPORT* Clinical Data: Altered mental status; worsening lethargy for 1 week. CT HEAD WITHOUT CONTRAST Technique: Contiguous axial images were obtained from the base of the skull through the  vertex without contrast. Comparison: CT of the head performed 07/13/2008 Findings: There is no evidence of acute infarction, mass lesion, or intra- or extra-axial hemorrhage on CT. Prominence of the ventricles and sulci reflects moderate cortical volume loss. Chronic lacunar infarcts are seen within the right basal ganglia. Scattered periventricular white matter change likely reflects small vessel  ischemic microangiopathy. The brainstem and fourth ventricle are within normal limits. The cerebral hemispheres demonstrate grossly normal gray-white differentiation. No mass effect or midline shift is seen. There is no evidence of fracture; visualized osseous structures are unremarkable in appearance. The orbits are within normal limits. The paranasal sinuses and mastoid air cells are well-aerated. No significant soft tissue abnormalities are seen. IMPRESSION: 1. No acute intracranial pathology seen on CT. 2. Moderate cortical volume loss and scattered small vessel ischemic microangiopathy. 3. Chronic lacunar infarcts within the right basal ganglia. Original Report Authenticated By: Tonia Ghent, M.D.   Medications and allergies reviewed and updated in chart.  Past histories reviewed and updated if relevant as below. Patient Active Problem List   Diagnosis Date Noted  . GAD (generalized anxiety disorder)   . Depression   . Incontinence   . RLS (restless legs syndrome)   . Vitamin D deficiency   . Osteoporosis   . Dementia 10/09/2012  . UTI (lower urinary tract infection) 10/09/2012  . Hypokalemia 10/09/2012  . Hypothyroidism 10/09/2012  . HTN (hypertension) 10/09/2012  . Diarrhea 10/09/2012  . Hypernatremia 10/09/2012  . CKD (chronic kidney disease), stage III 10/09/2012   Past Medical History  Diagnosis Date  . Hypertension   . Hypothyroidism   . HLD (hyperlipidemia)   . Osteoporosis   . Dementia   . Vitamin D deficiency   . RLS (restless legs syndrome)   . Allergic rhinitis   . Incontinence     Bowel and bladder  . Hx: UTI (urinary tract infection)   . History of asthma   . Depression   . GAD (generalized anxiety disorder)    Past Surgical History  Procedure Laterality Date  . Mri/mra  2010    WNL  . Sleep study  2010  . Dexa  11/2011    f/u 2 yrs?  . Dexa  06/2009    Tscore spine -0.58, hip -2.67   History  Substance Use Topics  . Smoking status: Passive Smoke  Exposure - Never Smoker  . Smokeless tobacco: Never Used     Comment: Daily exposure  . Alcohol Use: No   Family History  Problem Relation Age of Onset  . CAD Father     CABG  . Dementia Father 31  . Kidney disease Mother   . Cancer Paternal Aunt     breast and pancras   Allergies  Allergen Reactions  . Exelon (Rivastigmine Tartrate) Other (See Comments)    Patch caused skin rash  . Sulfa Antibiotics Other (See Comments)    Unknown reaction per pt's daughters   Current Outpatient Prescriptions on File Prior to Visit  Medication Sig Dispense Refill  . Calcium Carbonate-Vitamin D (CALCIUM 600 + D PO) Take 1 tablet by mouth daily.      . divalproex (DEPAKOTE SPRINKLE) 125 MG capsule Take 250 mg by mouth 3 (three) times daily.      . feeding supplement (ENSURE COMPLETE) LIQD Take 237 mLs by mouth 2 (two) times daily between meals.  30 Bottle  0  . levothyroxine (SYNTHROID, LEVOTHROID) 25 MCG tablet Take 25 mcg by mouth daily before breakfast.      .  pramipexole (MIRAPEX) 0.25 MG tablet Take 0.25 mg by mouth at bedtime.      . sertraline (ZOLOFT) 50 MG tablet Take 50 mg by mouth daily.      . Dietary Management Product (AXONA) packet Take 40 g by mouth at bedtime.      . Memantine HCl ER (NAMENDA XR) 28 MG CP24 Take 28 mg by mouth at bedtime.       No current facility-administered medications on file prior to visit.    Review of Systems  Unable to perform ROS: Dementia  Constitutional: Negative for fever, chills, activity change, appetite change, fatigue and unexpected weight change.  HENT: Negative for hearing loss and neck pain.   Respiratory: Negative for cough, chest tightness, shortness of breath and wheezing.   Cardiovascular: Negative for chest pain and leg swelling.  Gastrointestinal: Positive for diarrhea. Negative for vomiting, abdominal pain, constipation, blood in stool and abdominal distention.  Genitourinary: Negative for hematuria and difficulty urinating.   Musculoskeletal: Negative for myalgias and arthralgias.  Skin: Negative for rash.  Neurological: Negative for dizziness, seizures, syncope and headaches.  Hematological: Negative for adenopathy. Bruises/bleeds easily.       Objective:   Physical Exam  Nursing note and vitals reviewed. Constitutional: She is oriented to person, place, and time. She appears well-developed and well-nourished. No distress.  Thin frail elderly female, demented sitting in wheelchair, nonverbal  HENT:  Head: Normocephalic and atraumatic.  Right Ear: External ear normal.  Left Ear: External ear normal.  Nose: Nose normal.  Mouth/Throat: No oropharyngeal exudate.  Eyes: Conjunctivae and EOM are normal. Pupils are equal, round, and reactive to light. No scleral icterus.  Neck: Normal range of motion. Neck supple.  Cardiovascular: Normal rate, regular rhythm, normal heart sounds and intact distal pulses.   No murmur heard. Pulses:      Radial pulses are 2+ on the right side, and 2+ on the left side.  Pulmonary/Chest: Effort normal and breath sounds normal. No respiratory distress. She has no wheezes. She has no rales.  Abdominal: Soft. Bowel sounds are normal. She exhibits no distension and no mass. There is no tenderness. There is no rebound and no guarding.  Musculoskeletal: Normal range of motion. She exhibits no edema.  2+ DP bilaterally  Lymphadenopathy:    She has no cervical adenopathy.  Neurological: She is alert and oriented to person, place, and time.  CN grossly intact, station and gait intact  Skin: Skin is warm and dry. Rash noted.  Erythematous rash on bilateral buttocks spreading to perineal region  Psychiatric: She has a normal mood and affect. Her behavior is normal. Judgment and thought content normal.       Assessment & Plan:

## 2012-10-18 DIAGNOSIS — G4700 Insomnia, unspecified: Secondary | ICD-10-CM | POA: Insufficient documentation

## 2012-10-18 DIAGNOSIS — R2689 Other abnormalities of gait and mobility: Secondary | ICD-10-CM | POA: Insufficient documentation

## 2012-10-18 DIAGNOSIS — L22 Diaper dermatitis: Secondary | ICD-10-CM | POA: Insufficient documentation

## 2012-10-18 DIAGNOSIS — R1310 Dysphagia, unspecified: Secondary | ICD-10-CM | POA: Insufficient documentation

## 2012-10-18 NOTE — Assessment & Plan Note (Signed)
Ongoing over last 6-8 months, not associated with significant weight loss but associated with malnutrition. Anticipated oropharyngeal phase dysphagia related to dementia. See malnutrition plan.  If no noted improvement in nutrition status, consider GI referral or barium swallow.

## 2012-10-18 NOTE — Assessment & Plan Note (Signed)
Likely will not repeat DEXA, but continue to monitor.  Encouraged continued supplementation of cal/vit D

## 2012-10-18 NOTE — Assessment & Plan Note (Signed)
H/o this, however with progression of dementia this is no longer an issue. Will continue zoloft, will taper off klonopin.

## 2012-10-18 NOTE — Assessment & Plan Note (Signed)
On supplementation 

## 2012-10-18 NOTE — Assessment & Plan Note (Addendum)
H/o CKD noted over last several years, currently stage 2.

## 2012-10-18 NOTE — Assessment & Plan Note (Signed)
Consider trial off pramipex.

## 2012-10-18 NOTE — Assessment & Plan Note (Signed)
Anticipate due to severe dementia. Provided with script for hospital bed and for wheelchair to decrease risk of falls. HHPT currently involved.

## 2012-10-18 NOTE — Assessment & Plan Note (Signed)
Full incontinence 2/2 severe dementia

## 2012-10-18 NOTE — Assessment & Plan Note (Signed)
Continue synthroid. Lab Results  Component Value Date   TSH 0.967 10/09/2012

## 2012-10-18 NOTE — Assessment & Plan Note (Addendum)
Anticipate dementia related. Off ambien. Klonopin not helping. Recommended trial of trazodone 25-50mg  nightly - prescription provided. Discussed cardiovascular risks of this med.

## 2012-10-18 NOTE — Assessment & Plan Note (Signed)
Significant malnutrition as evidenced by albumin of 2.3 and protein level of 5.5. Current diet is soft mechanical diet 2/2 dysphagia, but mainly starchy carbs, low in protein. Discussed importance of nutrition status.  Recommended start ensure prior to meals, bid if able.  Will also suggest we mash chicken/beans and other high protein foods to increase protein intake. Consider barium swallow vs GI referral in future. Consider eval for hospice. Briefly discussed feeding tube, but I doubt she would do well with this due to increased aspiration risk, infection risk, and risk of inadvertently removing 2/2 dementia.

## 2012-10-18 NOTE — Assessment & Plan Note (Signed)
Stable on zoloft - continue.  

## 2012-10-18 NOTE — Assessment & Plan Note (Signed)
c diff neg in hospital.  S/p CTX and Cipro 500mg  7d course to treat UTI.

## 2012-10-18 NOTE — Assessment & Plan Note (Signed)
I have recommended decreasing amlodipine to 5mg  daily, and if BP remains well controlled will try to taper off antihypertensives.

## 2012-10-18 NOTE — Assessment & Plan Note (Signed)
Completed treatment for Enterobacter UTI with 7 d cipro 500mg  bid.

## 2012-10-18 NOTE — Assessment & Plan Note (Signed)
Improving. Recommended start nystatin or lotrimin otc and continue desitin/AD ointment as barrier. Discussed monitoring for pressure sores.

## 2012-10-18 NOTE — Assessment & Plan Note (Addendum)
Anticipate advanced vascular vs alzheimer's dementia.  Has not seen neurologist recently. She is on several psychotropics, and given advanced dementia doubt really helping. Ok to stop [place]. Family reports she has never had significant behavioral component to dementia (no agitation, no combativeness).  I have asked for records from psychiatrist, and after reviewing will likely recommend taper off psychotropics.  If behavioral issues develop, consider restarting aricept.  Start with slow taper off klonopin. Discussed fall risk of this med. May need hospice involvement in future if nutrition status does not improve. Given this is my first time meeting patient, difficult to tell what is acute change vs chronic impairment.

## 2012-10-22 ENCOUNTER — Encounter: Payer: Self-pay | Admitting: Family Medicine

## 2012-10-23 ENCOUNTER — Other Ambulatory Visit: Payer: Self-pay | Admitting: *Deleted

## 2012-10-23 MED ORDER — CLONAZEPAM 1 MG PO TABS: 1.0000 mg | ORAL_TABLET | Freq: Two times a day (BID) | ORAL | Status: DC | PRN

## 2012-10-23 NOTE — Telephone Encounter (Signed)
plz phone in. 

## 2012-10-23 NOTE — Telephone Encounter (Signed)
OK to refill? No longer to Dr. Donell Beers who normally had been prescribing med.

## 2012-10-24 NOTE — Telephone Encounter (Signed)
Rx called in as directed.   

## 2012-10-27 ENCOUNTER — Encounter: Payer: Self-pay | Admitting: Family Medicine

## 2012-10-28 ENCOUNTER — Telehealth: Payer: Self-pay | Admitting: Family Medicine

## 2012-10-28 ENCOUNTER — Telehealth: Payer: Self-pay | Admitting: *Deleted

## 2012-10-28 MED ORDER — NYSTATIN 100000 UNIT/GM EX CREA: TOPICAL_CREAM | Freq: Two times a day (BID) | CUTANEOUS | Status: DC

## 2012-10-28 NOTE — Telephone Encounter (Signed)
Recommend use lotrimin and barrier cream on top. If not better with this, may try nystatin cream - sent to pharmacy. If not better, suggest come in for office visit to reassess. I'm glad appetite is improved.

## 2012-10-28 NOTE — Telephone Encounter (Signed)
Ok to do - thank you.  

## 2012-10-28 NOTE — Telephone Encounter (Signed)
Tiffany PT with [facility] left v/m requesting verbal order to add OT, speech therapy and social worker, Please advise.

## 2012-10-28 NOTE — Telephone Encounter (Signed)
Patient's rash on buttock area has gotten worse. Family had been using barrier cream, A& D ointment and now has been using OTC Lotrimin bid x 1 week with no change. Rash is now starting to crack and bleed some. It doesn't seem to be uncomfortable to patient. Good news is she is eating much better and becoming much more aware since eating higher protein foods. Any suggestions?

## 2012-10-29 NOTE — Telephone Encounter (Signed)
Daughter notified. Had not been using barrier cream in addition to lotrimin. Will try nystatin if no improvement.

## 2012-10-29 NOTE — Telephone Encounter (Signed)
Message left notifying Tiffany. Advised to call me back and confirm she received message.

## 2012-11-11 ENCOUNTER — Other Ambulatory Visit: Payer: Self-pay | Admitting: *Deleted

## 2012-11-11 MED ORDER — LEVOTHYROXINE SODIUM 25 MCG PO TABS: 25.0000 ug | ORAL_TABLET | Freq: Every day | ORAL | Status: DC

## 2012-11-11 MED ORDER — ATORVASTATIN CALCIUM 80 MG PO TABS: 80.0000 mg | ORAL_TABLET | Freq: Every day | ORAL | Status: DC

## 2012-11-11 NOTE — Telephone Encounter (Signed)
Ok to refill? Would like 90 days supply if possible. If not, it's ok.

## 2012-11-12 MED ORDER — PRAMIPEXOLE DIHYDROCHLORIDE 0.25 MG PO TABS: 0.2500 mg | ORAL_TABLET | Freq: Every day | ORAL | Status: DC

## 2012-11-12 NOTE — Telephone Encounter (Signed)
Sent in

## 2012-11-13 ENCOUNTER — Encounter: Payer: Self-pay | Admitting: *Deleted

## 2012-11-14 ENCOUNTER — Encounter: Payer: Self-pay | Admitting: Family Medicine

## 2012-11-14 ENCOUNTER — Ambulatory Visit (INDEPENDENT_AMBULATORY_CARE_PROVIDER_SITE_OTHER): Payer: Medicare Other | Admitting: Family Medicine

## 2012-11-14 VITALS — BP 110/70 | HR 76 | Temp 97.3°F | Wt 118.2 lb

## 2012-11-14 DIAGNOSIS — R1310 Dysphagia, unspecified: Secondary | ICD-10-CM

## 2012-11-14 DIAGNOSIS — B3749 Other urogenital candidiasis: Secondary | ICD-10-CM

## 2012-11-14 DIAGNOSIS — F0390 Unspecified dementia without behavioral disturbance: Secondary | ICD-10-CM

## 2012-11-14 DIAGNOSIS — E46 Unspecified protein-calorie malnutrition: Secondary | ICD-10-CM

## 2012-11-14 DIAGNOSIS — L22 Diaper dermatitis: Secondary | ICD-10-CM

## 2012-11-14 DIAGNOSIS — I10 Essential (primary) hypertension: Secondary | ICD-10-CM

## 2012-11-14 NOTE — Assessment & Plan Note (Signed)
Advanced dementia.  On several psychotropics per prior PCP/psychiatrist. I have discussed slow titration off these meds - daughter agrees. Has been on klonopin for years - last month we decreased from 3 daily to 2 daily.  No change today - will continue titration off next visit. I have not received records from psychiatrist. Actually she is much more perky today compared to last month's initial visit.

## 2012-11-14 NOTE — Assessment & Plan Note (Signed)
Per daughters, appetite improved, as well as eating better.  Ensure supplement once a day. Continued weight loss noted. Will continue to monitor, check prealb/Alb next visit.

## 2012-11-14 NOTE — Assessment & Plan Note (Signed)
Per HH speech, swallowing mechanism intact.  Working with OT on liquids. No changes today.

## 2012-11-14 NOTE — Patient Instructions (Addendum)
Let's hold amlodipine and monitor blood pressures off this medicine. Return in 2 months for recheck and we will check blood work then. Good to see you today.  Mrs Guile is doing well today.

## 2012-11-14 NOTE — Progress Notes (Signed)
  Subjective:    Patient ID: Krista [name], female    DOB: 16-May-1940, 72 y.o.   MRN: [id number]  HPI CC: 1 mo f/u  Rebounded well since hospitalization. Hospital bed has significantly helped her sleep. Eating better.  Appetite improved. Has seen speech therapy and OT - swallowing mechanism intact, but trouble with vehicle for liquid.  HTN - on amlodipine 5mg  daily.  bp averaging 100-110/60-70.  Checking bp at home once a week.  Rash on bottom - improved, but not resolved yet.  Initially tried lotrimin, didn't help.  Now using nystatin.  No longer bleeding.  Wt Readings from Last 3 Encounters:  11/14/12 118 lb 4 oz (53.638 kg)  10/10/12 124 lb 5.4 oz (56.4 kg)  pt has always been small.  Daughter not concerned with current weight.  BP Readings from Last 3 Encounters:  11/14/12 110/70  10/17/12 108/70  10/10/12 135/53   Past Medical History  Diagnosis Date  . Hypertension   . Hypothyroidism   . HLD (hyperlipidemia)   . Osteoporosis   . Dementia   . Vitamin D deficiency   . RLS (restless legs syndrome)   . Allergic rhinitis   . Incontinence     Bowel and bladder  . Hx: UTI (urinary tract infection)   . History of asthma   . Depression   . GAD (generalized anxiety disorder)      Review of Systems Per HPI    Objective:   Physical Exam  Nursing note and vitals reviewed. Constitutional: She appears well-developed and well-nourished. No distress.  HENT:  Head: Normocephalic and atraumatic.  Cardiovascular: Normal rate, regular rhythm, normal heart sounds and intact distal pulses.   No murmur heard. Pulmonary/Chest: Effort normal and breath sounds normal. No respiratory distress. She has no wheezes. She has no rales.  Musculoskeletal: She exhibits no edema.  Skin: Skin is warm. Rash noted.  Resolving erythematous rash on buttock, some scaling  Psychiatric:  Nonverbal but cooperative       Assessment & Plan:

## 2012-11-14 NOTE — Assessment & Plan Note (Signed)
Very well controlled on lower dose amlodipine. I suggested trial off antihypertensive She has nurse aide checking blood pressure once a week - will monitor off amlodipine.

## 2012-11-14 NOTE — Assessment & Plan Note (Signed)
Improving. Pt will get second opinion from derm next week.

## 2012-11-23 ENCOUNTER — Encounter: Payer: Self-pay | Admitting: Family Medicine

## 2012-11-24 ENCOUNTER — Telehealth: Payer: Self-pay

## 2012-11-24 NOTE — Telephone Encounter (Signed)
Krista [name] OT with [facility] left v/m requestingverbal order for extension of OT for 4 additional visits over 2 week period due to missed visits due to scheduling difficulty.Please advise.

## 2012-11-24 NOTE — Telephone Encounter (Signed)
Ok to give verbal for below. Thanks.

## 2012-11-25 NOTE — Telephone Encounter (Signed)
Message left notifying Krista [name].

## 2012-12-04 ENCOUNTER — Encounter: Payer: Self-pay | Admitting: Family Medicine

## 2012-12-04 ENCOUNTER — Ambulatory Visit (INDEPENDENT_AMBULATORY_CARE_PROVIDER_SITE_OTHER): Payer: Medicare Other | Admitting: Family Medicine

## 2012-12-04 ENCOUNTER — Telehealth: Payer: Self-pay | Admitting: *Deleted

## 2012-12-04 VITALS — BP 112/82 | HR 80 | Temp 97.3°F

## 2012-12-04 DIAGNOSIS — R4182 Altered mental status, unspecified: Secondary | ICD-10-CM | POA: Insufficient documentation

## 2012-12-04 LAB — POCT URINALYSIS DIPSTICK
Glucose, UA: NEGATIVE
Ketones, UA: UNDETERMINED

## 2012-12-04 MED ORDER — CIPROFLOXACIN HCL 250 MG PO TABS: 250.0000 mg | ORAL_TABLET | Freq: Two times a day (BID) | ORAL | Status: DC

## 2012-12-04 NOTE — Telephone Encounter (Signed)
plz have her come in for office visit.

## 2012-12-04 NOTE — Patient Instructions (Addendum)
Urine checked today.  Looks like there is infection - treat with 5 days of cipro - sent to pharmacy. I have sent a culture today.  We will call you with results Let us know if any questions or concerns.

## 2012-12-04 NOTE — Telephone Encounter (Signed)
Patient's daughter called this AM and said patient is experiencing symptoms similar to what caused last hospital admission. (slumping over in chair, diarrhea and not being as alert). Symptoms started last PM. No fever. Concerned for UTI again, but no way of getting urine sample except for cath due to incontinence and confusion. Any chance of sending in abx or does she need to come in for cath?

## 2012-12-04 NOTE — Telephone Encounter (Signed)
Appt scheduled

## 2012-12-04 NOTE — Addendum Note (Signed)
Addended by: Josph Macho A on: 12/04/2012 04:58 PM   Modules accepted: Orders

## 2012-12-04 NOTE — Assessment & Plan Note (Addendum)
Dementia obfuscates picture. Check UA given h/o hospitalization with UTI recently - did I/O cath today. UA - large blood and large LE - micro: TNTC RBC and WBC, 3+ bact rods, no epi.  UCx sent.  Will treat with cipro 5d course while we await culture. Lab Results  Component Value Date   CREATININE 0.80 10/10/2012  Seems well hydrated today on exam - MMM, good skin turgor.  Will not check blood work.

## 2012-12-04 NOTE — Progress Notes (Signed)
  Subjective:    Patient ID: Krista [name], female    DOB: [birth year], 72 y.o.   MRN: [id number]  HPI CC: ?UTI  Last night noticed trouble staying upright, increased tremors, diarrhea (loose stools) x 3 and increased amounts.  Last night slightly more confused than normal.   This is how previous UTI presented - that led to hospitalization. UCx >100k Enterobacter aerogenes  Last night given OTC azo.  Eating well and drinking well.  No fever.  No change in urine odor or in urine character.  Normal voiding patterns.  No cough, no dyspnea, no wheezing.  Today she started looking and feeling better.  Wt Readings from Last 3 Encounters:  11/14/12 118 lb 4 oz (53.638 kg)  10/10/12 124 lb 5.4 oz (56.4 kg)    Past Medical History  Diagnosis Date  . Hypertension   . Hypothyroidism   . HLD (hyperlipidemia)   . Osteoporosis   . Dementia   . Vitamin D deficiency   . RLS (restless legs syndrome)   . Allergic rhinitis   . Incontinence     Bowel and bladder  . Hx: UTI (urinary tract infection)   . History of asthma   . Depression   . GAD (generalized anxiety disorder)     Review of Systems Per HPI    Objective:   Physical Exam  Nursing note and vitals reviewed. Constitutional: She appears well-developed and well-nourished. No distress.  thin  HENT:  Head: Normocephalic and atraumatic.  Mouth/Throat: Oropharynx is clear and moist. No oropharyngeal exudate.  Eyes: Conjunctivae and EOM are normal. Pupils are equal, round, and reactive to light. No scleral icterus.  Cardiovascular: Normal rate, regular rhythm, normal heart sounds and intact distal pulses.   No murmur heard. Pulmonary/Chest: Effort normal and breath sounds normal. No respiratory distress. She has no wheezes. She has no rales.  Abdominal: Soft. Bowel sounds are normal. She exhibits no distension and no mass. There is no tenderness. There is no rebound and no guarding.  Musculoskeletal: She exhibits no edema.  Skin: Skin  is warm and dry. No rash noted.  Psychiatric:  Calm, nonverbal, slightly increased tremor       Assessment & Plan:

## 2012-12-06 LAB — URINE CULTURE: Colony Count: >=100000

## 2012-12-11 ENCOUNTER — Ambulatory Visit: Payer: Self-pay | Admitting: Family Medicine

## 2012-12-11 ENCOUNTER — Other Ambulatory Visit: Payer: Self-pay | Admitting: *Deleted

## 2012-12-11 NOTE — Telephone Encounter (Signed)
Ok to refill 

## 2012-12-12 MED ORDER — CLONAZEPAM 1 MG PO TABS: 1.0000 mg | ORAL_TABLET | Freq: Two times a day (BID) | ORAL | Status: DC | PRN

## 2012-12-12 NOTE — Telephone Encounter (Signed)
Rx called in as directed.   

## 2012-12-12 NOTE — Telephone Encounter (Signed)
plz phone in. 

## 2012-12-23 ENCOUNTER — Telehealth: Payer: Self-pay | Admitting: *Deleted

## 2012-12-23 ENCOUNTER — Emergency Department (HOSPITAL_COMMUNITY): Payer: Medicare Other

## 2012-12-23 ENCOUNTER — Encounter (HOSPITAL_COMMUNITY): Payer: Self-pay | Admitting: Emergency Medicine

## 2012-12-23 ENCOUNTER — Observation Stay (HOSPITAL_COMMUNITY): Payer: Medicare Other

## 2012-12-23 ENCOUNTER — Inpatient Hospital Stay (HOSPITAL_COMMUNITY)
Admission: EM | Admit: 2012-12-23 | Discharge: 2012-12-27 | DRG: 371 | Disposition: A | Discharge status: Skilled Nursing Facility | Payer: Medicare Other | Attending: Family Medicine | Admitting: Family Medicine

## 2012-12-23 DIAGNOSIS — E559 Vitamin D deficiency, unspecified: Secondary | ICD-10-CM

## 2012-12-23 DIAGNOSIS — E43 Unspecified severe protein-calorie malnutrition: Secondary | ICD-10-CM | POA: Insufficient documentation

## 2012-12-23 DIAGNOSIS — J45909 Unspecified asthma, uncomplicated: Secondary | ICD-10-CM | POA: Diagnosis present

## 2012-12-23 DIAGNOSIS — E876 Hypokalemia: Secondary | ICD-10-CM

## 2012-12-23 DIAGNOSIS — Z79899 Other long term (current) drug therapy: Secondary | ICD-10-CM

## 2012-12-23 DIAGNOSIS — G2581 Restless legs syndrome: Secondary | ICD-10-CM

## 2012-12-23 DIAGNOSIS — IMO0002 Reserved for concepts with insufficient information to code with codable children: Secondary | ICD-10-CM

## 2012-12-23 DIAGNOSIS — E039 Hypothyroidism, unspecified: Secondary | ICD-10-CM | POA: Diagnosis present

## 2012-12-23 DIAGNOSIS — R627 Adult failure to thrive: Secondary | ICD-10-CM | POA: Diagnosis present

## 2012-12-23 DIAGNOSIS — Z66 Do not resuscitate: Secondary | ICD-10-CM | POA: Diagnosis present

## 2012-12-23 DIAGNOSIS — Z681 Body mass index (BMI) 19 or less, adult: Secondary | ICD-10-CM

## 2012-12-23 DIAGNOSIS — A0472 Enterocolitis due to Clostridium difficile, not specified as recurrent: Principal | ICD-10-CM

## 2012-12-23 DIAGNOSIS — B372 Candidiasis of skin and nail: Secondary | ICD-10-CM

## 2012-12-23 DIAGNOSIS — R2689 Other abnormalities of gait and mobility: Secondary | ICD-10-CM

## 2012-12-23 DIAGNOSIS — M810 Age-related osteoporosis without current pathological fracture: Secondary | ICD-10-CM

## 2012-12-23 DIAGNOSIS — E785 Hyperlipidemia, unspecified: Secondary | ICD-10-CM | POA: Diagnosis present

## 2012-12-23 DIAGNOSIS — R4182 Altered mental status, unspecified: Secondary | ICD-10-CM | POA: Diagnosis present

## 2012-12-23 DIAGNOSIS — F0390 Unspecified dementia without behavioral disturbance: Secondary | ICD-10-CM | POA: Diagnosis present

## 2012-12-23 DIAGNOSIS — I10 Essential (primary) hypertension: Secondary | ICD-10-CM

## 2012-12-23 DIAGNOSIS — N182 Chronic kidney disease, stage 2 (mild): Secondary | ICD-10-CM

## 2012-12-23 DIAGNOSIS — G4700 Insomnia, unspecified: Secondary | ICD-10-CM

## 2012-12-23 DIAGNOSIS — E46 Unspecified protein-calorie malnutrition: Secondary | ICD-10-CM | POA: Diagnosis present

## 2012-12-23 DIAGNOSIS — F329 Major depressive disorder, single episode, unspecified: Secondary | ICD-10-CM

## 2012-12-23 DIAGNOSIS — R1310 Dysphagia, unspecified: Secondary | ICD-10-CM

## 2012-12-23 DIAGNOSIS — F411 Generalized anxiety disorder: Secondary | ICD-10-CM

## 2012-12-23 DIAGNOSIS — R32 Unspecified urinary incontinence: Secondary | ICD-10-CM

## 2012-12-23 LAB — CBC WITH DIFFERENTIAL/PLATELET
Basophils Absolute: 0 10*3/uL (ref 0.0–0.1)
Basophils Relative: 0 % (ref 0–1)
Eosinophils Absolute: 0 10*3/uL (ref 0.0–0.7)
Eosinophils Relative: 1 % (ref 0–5)
HCT: 39.1 % (ref 36.0–46.0)
Hemoglobin: 12.7 g/dL (ref 12.0–15.0)
Lymphocytes Relative: 25 % (ref 12–46)
Lymphs Abs: 1.5 10*3/uL (ref 0.7–4.0)
MCH: 28.5 pg (ref 26.0–34.0)
MCHC: 32.5 g/dL (ref 30.0–36.0)
MCV: 87.7 fL (ref 78.0–100.0)
Monocytes Absolute: 0.6 10*3/uL (ref 0.1–1.0)
Monocytes Relative: 10 % (ref 3–12)
Neutro Abs: 3.7 10*3/uL (ref 1.7–7.7)
Neutrophils Relative %: 64 % (ref 43–77)
Platelets: 158 10*3/uL (ref 150–400)
RBC: 4.46 MIL/uL (ref 3.87–5.11)
RDW: 14 % (ref 11.5–15.5)
WBC: 5.9 10*3/uL (ref 4.0–10.5)

## 2012-12-23 LAB — URINALYSIS, ROUTINE W REFLEX MICROSCOPIC
Glucose, UA: NEGATIVE mg/dL
Hgb urine dipstick: NEGATIVE
Ketones, ur: NEGATIVE mg/dL
Leukocytes, UA: NEGATIVE
Nitrite: NEGATIVE
Protein, ur: NEGATIVE mg/dL
Specific Gravity, Urine: 1.034 — ABNORMAL HIGH (ref 1.005–1.030)
Urobilinogen, UA: 0.2 mg/dL (ref 0.0–1.0)
pH: 6 (ref 5.0–8.0)

## 2012-12-23 LAB — LACTIC ACID, PLASMA: Lactic Acid, Venous: 1 mmol/L (ref 0.5–2.2)

## 2012-12-23 LAB — PHOSPHORUS: Phosphorus: 3.5 mg/dL (ref 2.3–4.6)

## 2012-12-23 LAB — COMPREHENSIVE METABOLIC PANEL
ALT: 25 U/L (ref 0–35)
Albumin: 3 g/dL — ABNORMAL LOW (ref 3.5–5.2)
BUN: 23 mg/dL (ref 6–23)
Calcium: 9.1 mg/dL (ref 8.4–10.5)
GFR calc Af Amer: 60 mL/min — ABNORMAL LOW (ref >90)
Glucose, Bld: 86 mg/dL (ref 70–99)
Sodium: 146 mEq/L — ABNORMAL HIGH (ref 135–145)
Total Protein: 6 g/dL (ref 6.0–8.3)

## 2012-12-23 LAB — MAGNESIUM: Magnesium: 2 mg/dL (ref 1.5–2.5)

## 2012-12-23 MED ORDER — DEXTROSE-NACL 5-0.45 % IV SOLN
INTRAVENOUS | Status: DC
  Administered 2012-12-23 – 2012-12-27 (×8): via INTRAVENOUS

## 2012-12-23 MED ORDER — POTASSIUM CHLORIDE 10 MEQ/100ML IV SOLN
10.0000 meq | INTRAVENOUS | Status: AC
  Administered 2012-12-23 – 2012-12-24 (×4): 10 meq via INTRAVENOUS
  Filled 2012-12-23 (×4): qty 100

## 2012-12-23 MED ORDER — ENSURE COMPLETE PO LIQD
237.0000 mL | Freq: Every day | ORAL | Status: DC
  Administered 2012-12-24 (×2): 237 mL via ORAL
  Filled 2012-12-23 (×2): qty 237

## 2012-12-23 MED ORDER — HEPARIN SODIUM (PORCINE) 5000 UNIT/ML IJ SOLN
5000.0000 [IU] | Freq: Three times a day (TID) | INTRAMUSCULAR | Status: DC
  Administered 2012-12-24 – 2012-12-27 (×10): 5000 [IU] via SUBCUTANEOUS
  Filled 2012-12-23 (×14): qty 1

## 2012-12-23 MED ORDER — SODIUM CHLORIDE 0.9 % IV BOLUS (SEPSIS)
1000.0000 mL | Freq: Once | INTRAVENOUS | Status: AC
  Administered 2012-12-23: 1000 mL via INTRAVENOUS

## 2012-12-23 MED ORDER — CLONAZEPAM 1 MG PO TABS: 1.0000 mg | ORAL_TABLET | Freq: Two times a day (BID) | ORAL | Status: DC | PRN

## 2012-12-23 MED ORDER — TRAZODONE HCL 50 MG PO TABS
50.0000 mg | ORAL_TABLET | Freq: Every evening | ORAL | Status: DC | PRN
  Administered 2012-12-25 – 2012-12-27 (×2): 50 mg via ORAL
  Filled 2012-12-23 (×2): qty 1

## 2012-12-23 MED ORDER — PRAMIPEXOLE DIHYDROCHLORIDE 0.25 MG PO TABS
0.2500 mg | ORAL_TABLET | Freq: Every day | ORAL | Status: DC
  Administered 2012-12-24 – 2012-12-26 (×3): 0.25 mg via ORAL
  Filled 2012-12-23 (×5): qty 1

## 2012-12-23 MED ORDER — LEVOTHYROXINE SODIUM 25 MCG PO TABS
25.0000 ug | ORAL_TABLET | Freq: Every day | ORAL | Status: DC
  Administered 2012-12-24 – 2012-12-27 (×4): 25 ug via ORAL
  Filled 2012-12-23 (×5): qty 1

## 2012-12-23 MED ORDER — SERTRALINE HCL 50 MG PO TABS
50.0000 mg | ORAL_TABLET | Freq: Every day | ORAL | Status: DC
  Administered 2012-12-24 – 2012-12-27 (×4): 50 mg via ORAL
  Filled 2012-12-23 (×4): qty 1

## 2012-12-23 MED ORDER — ATORVASTATIN CALCIUM 80 MG PO TABS
80.0000 mg | ORAL_TABLET | Freq: Every day | ORAL | Status: DC
  Administered 2012-12-24 – 2012-12-27 (×4): 80 mg via ORAL
  Filled 2012-12-23 (×5): qty 1

## 2012-12-23 MED ORDER — DIVALPROEX SODIUM 125 MG PO CPSP
250.0000 mg | ORAL_CAPSULE | Freq: Three times a day (TID) | ORAL | Status: DC
  Administered 2012-12-24 – 2012-12-27 (×10): 250 mg via ORAL
  Filled 2012-12-23 (×13): qty 2

## 2012-12-23 MED ORDER — IOHEXOL 300 MG/ML  SOLN
80.0000 mL | Freq: Once | INTRAMUSCULAR | Status: AC | PRN
  Administered 2012-12-23: 80 mL via INTRAVENOUS

## 2012-12-23 MED ORDER — TRIAMCINOLONE ACETONIDE 0.1 % EX CREA
1.0000 "application " | TOPICAL_CREAM | Freq: Two times a day (BID) | CUTANEOUS | Status: DC | PRN
  Filled 2012-12-23: qty 15

## 2012-12-23 NOTE — Telephone Encounter (Signed)
Thanks for the update.  I'm sorry she's not doing well.  Let's wait and see what ER evaluation reveals.

## 2012-12-23 NOTE — ED Notes (Signed)
Patient transported to CT 

## 2012-12-23 NOTE — Progress Notes (Signed)
Patient's daughters given proxy form for My Chart. Briscoe Burns BSN, RN-BC Admissions RN  12/23/2012 7:46 PM

## 2012-12-23 NOTE — ED Notes (Signed)
Dr Gardner at bedside to evaluate patient

## 2012-12-23 NOTE — ED Notes (Signed)
Per EMS pt comes from home where her daughter states that pt hasnt been eating and drinking the past 36 hours and pt has similar symptoms to when she has had prior UTI.  Pt has had diarrhea.  Pt has been nonverbal for EMS which is her baseline. Pt has dementia.

## 2012-12-23 NOTE — Telephone Encounter (Signed)
Ok. Family aware.

## 2012-12-23 NOTE — ED Provider Notes (Signed)
CSN: [id number]     Arrival date & time 12/23/12  1509 History     First MD Initiated Contact with Patient 12/23/12 1525     Chief Complaint  Patient presents with  . possible UTI   (Consider location/radiation/quality/duration/timing/severity/associated sxs/prior Treatment) HPI Comments: Pt presents with decreased PO intake episode of abdominal pain last night as witnessed by daughter.  She has refused all PO intake for two days.  She is at her baseline mental status.  No fever, vom, but has had some d/a for 2 days. Daighter statues this is how she has acted in past with a UTI.   Patient is a 72 y.o. female presenting with abdominal pain. The history is provided by a relative. The history is limited by the condition of the patient. No language interpreter was used.  Abdominal Pain Pain location:  Generalized (unable to characterize given dementia) Pain quality comment:  Unable to characterize given dementia Pain radiation: unable to characterize given dementia. Onset quality:  Sudden Timing:  Unable to specify Progression:  Resolved Chronicity:  Recurrent Context: not diet changes, not recent illness, not recent travel, not retching, not sick contacts, not suspicious food intake and not trauma   Relieved by:  Nothing Worsened by:  Nothing tried Ineffective treatments:  None tried Associated symptoms: anorexia and diarrhea   Associated symptoms: no chest pain, no chills, no cough, no fatigue, no fever, no nausea, no shortness of breath and no vomiting   Diarrhea:    Quality:  Watery   Duration:  2 days   Timing:  Intermittent   Progression:  Unchanged   Past Medical History  Diagnosis Date  . Hypertension   . Hypothyroidism   . HLD (hyperlipidemia)   . Osteoporosis   . Dementia   . Vitamin D deficiency   . RLS (restless legs syndrome)   . Allergic rhinitis   . Incontinence     Bowel and bladder  . Hx: UTI (urinary tract infection)   . History of asthma   . Depression    . GAD (generalized anxiety disorder)    Past Surgical History  Procedure Laterality Date  . Mri/mra  2010    WNL  . Sleep study  2010  . Dexa  11/2011    f/u 2 yrs?  . Dexa  06/2009    Tscore spine -0.58, hip -2.67   Family History  Problem Relation Age of Onset  . CAD Father     CABG  . Dementia Father 31  . Kidney disease Mother   . Cancer Paternal Aunt     breast and pancras   History  Substance Use Topics  . Smoking status: Passive Smoke Exposure - Never Smoker  . Smokeless tobacco: Never Used     Comment: Daily exposure  . Alcohol Use: No   OB History   Grav Para Term Preterm Abortions TAB SAB Ect Mult Living                 Review of Systems  Unable to perform ROS: Patient nonverbal  Constitutional: Positive for activity change and appetite change. Negative for fever, chills, diaphoresis and fatigue.  HENT: Negative for congestion, facial swelling, rhinorrhea, neck pain and neck stiffness.   Eyes: Negative for discharge.  Respiratory: Negative for cough, chest tightness and shortness of breath.   Cardiovascular: Negative for chest pain, palpitations and leg swelling.  Gastrointestinal: Positive for abdominal pain, diarrhea and anorexia. Negative for nausea, vomiting and blood in stool.  Endocrine: Negative for polydipsia and polyuria.  Genitourinary: Negative for frequency and difficulty urinating.  Skin: Negative for color change and wound.  Allergic/Immunologic: Negative for immunocompromised state.  Neurological: Negative for facial asymmetry and weakness.  Hematological: Does not bruise/bleed easily.  Psychiatric/Behavioral: Negative for agitation.    Allergies  Exelon and Sulfa antibiotics  Home Medications   No current outpatient prescriptions on file. BP 133/65  Pulse 59  Temp(Src) 98.1 F (36.7 C) (Axillary)  Resp 18  Ht 5\' 4"  (1.626 m)  Wt 109 lb (49.442 kg)  BMI 18.7 kg/m2  SpO2 99% Physical Exam  Constitutional: She appears  well-developed and well-nourished. No distress.  HENT:  Head: Normocephalic and atraumatic.  Mouth/Throat: No oropharyngeal exudate.  Eyes: Pupils are equal, round, and reactive to light.  Neck: Normal range of motion. Neck supple.  Cardiovascular: Normal rate, regular rhythm and normal heart sounds.  Exam reveals no gallop and no friction rub.   No murmur heard. Pulmonary/Chest: Effort normal and breath sounds normal. No respiratory distress. She has no wheezes. She has no rales.  Abdominal: Soft. Bowel sounds are normal. She exhibits no distension and no mass. There is tenderness in the suprapubic area. There is no rebound and no guarding.  Winces w/ suprapubic palpation  Musculoskeletal: Normal range of motion. She exhibits no edema and no tenderness.  Neurological: She is alert. She has normal strength. GCS eye subscore is 4. GCS verbal subscore is 1. GCS motor subscore is 6.  Generalized muscle atrophy. Pt at baseline mental status per daughter  Skin: Skin is warm and dry.  Psychiatric: She has a normal mood and affect.    ED Course   Procedures (including critical care time)  Labs Reviewed  URINALYSIS, ROUTINE W REFLEX MICROSCOPIC - Abnormal; Notable for the following:    Specific Gravity, Urine 1.034 (*)    Bilirubin Urine SMALL (*)    All other components within normal limits  COMPREHENSIVE METABOLIC PANEL - Abnormal; Notable for the following:    Sodium 146 (*)    Potassium 3.0 (*)    Albumin 3.0 (*)    AST 56 (*)    GFR calc non Af Amer 52 (*)    GFR calc Af Amer 60 (*)    All other components within normal limits  CBC - Abnormal; Notable for the following:    Hemoglobin 11.3 (*)    HCT 34.9 (*)    Platelets 131 (*)    All other components within normal limits  BASIC METABOLIC PANEL - Abnormal; Notable for the following:    Potassium 3.2 (*)    Calcium 8.3 (*)    GFR calc non Af Amer 82 (*)    All other components within normal limits  URINE CULTURE   CLOSTRIDIUM DIFFICILE BY PCR  CBC WITH DIFFERENTIAL  LACTIC ACID, PLASMA  MAGNESIUM  PHOSPHORUS  VITAMIN B12  FOLATE  RPR  HIV ANTIBODY (ROUTINE TESTING)   Ct Head Wo Contrast  12/23/2012   *RADIOLOGY REPORT*  Clinical Data: Demented.  Altered mental status.  History of prior urinary tract infection.  CT HEAD WITHOUT CONTRAST  Technique:  Contiguous axial images were obtained from the base of the skull through the vertex without contrast.  Comparison: 10/09/2012.  Findings: No mass lesion, mass effect, midline shift, hydrocephalus, hemorrhage.  No acute territorial cortical ischemia/infarct. Atrophy and chronic ischemic white matter disease is present.  IMPRESSION: Atrophy and chronic ischemic white matter disease without acute intracranial abnormality.   Original  Report Authenticated By: Andreas Newport, M.D.   Ct Abdomen Pelvis W Contrast  12/23/2012   *RADIOLOGY REPORT*  Clinical Data: Abdominal pain  CT ABDOMEN AND PELVIS WITH CONTRAST  Technique:  Multidetector CT imaging of the abdomen and pelvis was performed following the standard protocol during bolus administration of intravenous contrast.  Contrast: 80mL OMNIPAQUE IOHEXOL 300 MG/ML  SOLN  Comparison: None.  Findings:  The lung bases appear clear.  No pleural or pericardial effusion. A focal area of low attenuation within the left hepatic lobe measures 7 x 9 mm, image 11/series 2.  Within the right hepatic lobe there is a 7 mm low attenuation structure.  These are both too small to reliably characterize.  The gallbladder appears within normal limits.  No biliary dilatation.  Normal appearance of the pancreas.  The spleen is normal.  The adrenal glands both appear normal.  Bilateral renal cortical thinning is noted.  Small cyst within the inferior pole of the right kidney measures 6 mm and is too small to reliably characterize.  No obstructive uropathy identified.  The urinary bladder appears normal.  The uterus and the adnexal structures  have a normal physiologic appearance for patient's age. No free fluid or fluid collection within the abdomen or pelvis.  The abdominal aorta has a normal caliber.  There is mild calcified atherosclerotic change noted.  No upper abdominal adenopathy. There is no pelvic or inguinal adenopathy noted.  The stomach appears normal.  The small bowel loops have a normal course and caliber without evidence for bowel obstruction.  The appendix is visualized and appears normal.  Normal appearance of the colon.  Review of the visualized osseous structures is significant for compression deformity involving the L1 vertebral body. This is similar to 07/13/2008.  IMPRESSION:  1.  No acute findings identified within the abdomen or the pelvis. 2.  Bilateral renal cortical thinning. 3.  Low attenuation structures within the liver parenchyma are too small to characterize.   Original Report Authenticated By: Signa Kell, M.D.   1. Failure to thrive   2. Adult failure to thrive   3. Dementia   4. Mental status change   5. Protein calorie malnutrition     MDM  Pt is a 72 y.o. female with pertinent PMHX as above who presents with decreased PO intake episode of abdominal pain last night as witnessed by daughter.  She has refused all PO intake for two days.  She is ather baseline mental status.  No fever, vom, but has had some d/a for 2 days.  On exam, VSS, pt in NAD.  She is uncooperative w/ neuro exam, but appears to have symmetric strength.  Lungs clear, abdominal exam benign.   W/U showed unremarkable CBC, mild hypernatremia, hypokalemia, stable Cr, nml lactate, nml UA. CT ab/pelvis also showed acute findings to suggest cause of pain.  Given pt not able to tolerate PO, medicine service consulted & will admit for observation.    1. Failure to thrive   2. Adult failure to thrive   3. Dementia   4. Mental status change   5. Protein calorie malnutrition      Shanna Cisco, MD 12/24/12 1103

## 2012-12-23 NOTE — Telephone Encounter (Signed)
FYI- patient is going to ER this morning for ? Dehydration,UTI and inability to ambulate. She hasn't been taking oral fluids well at all since last UTI and the diarrhea that she typically gets for some reason with a UTI has started along with not being able to walk or stand. If they admit her, do you think she should be set up with home health for daily IV fluids or just wait and see what's going on? She is to the point now she isn't drinking due to the dementia-she just doesn't understand the concept anymore.

## 2012-12-23 NOTE — H&P (Signed)
[facility] History and Physical  Krista [name] ZOX:[id number] DOB: [birth year] DOA: 12/23/2012  Referring physician: ED PCP: Eustaquio Boyden, MD   Chief Complaint: Altered mental status  HPI: Krista [name] is a 72 y.o. female who presents with several day history of not eating nor drinking.  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  She usually is like this when she has a UTI her daughter states.  Patient is not able to contribute to history due to severe dementia and being essentially non-verbal at this point.  In the ED work up was negative for UTI, PNA, CT abdomen was negative, she was running a temperature of 99.5, and was slightly hypokalemic at 3.0 but otherwise the remainder of the workup was unremarkable.  Admitting the patient for observation and CT head at this time.  Review of Systems: Unable to be performed due to mental status  Past Medical History  Diagnosis Date  . Hypertension   . Hypothyroidism   . HLD (hyperlipidemia)   . Osteoporosis   . Dementia   . Vitamin D deficiency   . RLS (restless legs syndrome)   . Allergic rhinitis   . Incontinence     Bowel and bladder  . Hx: UTI (urinary tract infection)   . History of asthma   . Depression   . GAD (generalized anxiety disorder)    Past Surgical History  Procedure Laterality Date  . Mri/mra  2010    WNL  . Sleep study  2010  . Dexa  11/2011    f/u 2 yrs?  . Dexa  06/2009    Tscore spine -0.58, hip -2.67   Social History:  reports that she has been passively smoking.  She has never used smokeless tobacco. She reports that she does not drink alcohol or use illicit drugs.   Allergies  Allergen Reactions  . Exelon [Rivastigmine Tartrate] Other (See Comments)    Patch caused skin rash  . Sulfa Antibiotics Other (See Comments)    Unknown reaction per pt's daughters    Family History  Problem Relation Age of Onset  .  CAD Father     CABG  . Dementia Father 19  . Kidney disease Mother   . Cancer Paternal Aunt     breast and pancras    Prior to Admission medications   Medication Sig Start Date End Date Taking? Authorizing Provider  atorvastatin (LIPITOR) 80 MG tablet Take 1 tablet (80 mg total) by mouth daily. 11/11/12  Yes Eustaquio Boyden, MD  Calcium Carbonate-Vitamin D (CALCIUM 600 + D PO) Take 1 tablet by mouth daily.   Yes Historical Provider, MD  clonazePAM (KLONOPIN) 1 MG tablet Take 1 tablet (1 mg total) by mouth 2 (two) times daily as needed for anxiety. 12/11/12  Yes Eustaquio Boyden, MD  divalproex (DEPAKOTE SPRINKLE) 125 MG capsule Take 250 mg by mouth 3 (three) times daily.   Yes Historical Provider, MD  feeding supplement (ENSURE COMPLETE) LIQD Take 237 mLs by mouth daily. 10/10/12  Yes Nishant Dhungel, MD  levothyroxine (SYNTHROID, LEVOTHROID) 25 MCG tablet Take 1 tablet (25 mcg total) by mouth daily before breakfast. 11/11/12  Yes Eustaquio Boyden, MD  pramipexole (MIRAPEX) 0.25 MG tablet Take 1 tablet (0.25 mg total) by mouth at bedtime. 11/11/12  Yes Eustaquio Boyden, MD  sertraline (ZOLOFT) 50 MG tablet Take 50 mg by mouth daily.   Yes Historical Provider, MD  traZODone (DESYREL) 50 MG tablet Take 50 mg by mouth at bedtime as needed for sleep. 10/17/12  Yes Eustaquio Boyden, MD  triamcinolone cream (KENALOG) 0.1 % Apply 1 application topically 2 (two) times daily as needed (for diaper rash).   Yes Historical Provider, MD   Physical Exam: Filed Vitals:   12/23/12 1528  BP: 125/91  Pulse: 71  Temp: 99.5 F (37.5 C)  Resp: 16     General:  NAD, resting comfortably in bed  Eyes: PEERLA EOMI  ENT: mucous membranes moist,   Neck: supple w/o JVD, neck supple, no evidence of pain with movement  Cardiovascular: RRR w/o MRG  Respiratory: CTA B  Abdomen: soft, nt, nd, bs+, cachectic  Skin: no rash nor lesion  Musculoskeletal: MAE, full ROM all 4 extremities  Psychiatric: Unable to  assess due to dementia, non-verbal  Neurologic: grossly non-focal, alert, severe dementia and does not appear oriented   Labs on Admission:  Basic Metabolic Panel:  Recent Labs Lab 12/23/12 1611  NA 146*  K 3.0*  CL 108  CO2 30  GLUCOSE 86  BUN 23  CREATININE 1.04  CALCIUM 9.1   Liver Function Tests:  Recent Labs Lab 12/23/12 1611  AST 56*  ALT 25  ALKPHOS 50  BILITOT 0.4  PROT 6.0  ALBUMIN 3.0*   No results found for this basename: LIPASE, AMYLASE,  in the last 168 hours No results found for this basename: AMMONIA,  in the last 168 hours CBC:  Recent Labs Lab 12/23/12 1611  WBC 5.9  NEUTROABS 3.7  HGB 12.7  HCT 39.1  MCV 87.7  PLT 158   Cardiac Enzymes: No results found for this basename: CKTOTAL, CKMB, CKMBINDEX, TROPONINI,  in the last 168 hours  BNP (last 3 results) No results found for this basename: PROBNP,  in the last 8760 hours CBG: No results found for this basename: GLUCAP,  in the last 168 hours  Radiological Exams on Admission: Ct Abdomen Pelvis W Contrast  12/23/2012   *RADIOLOGY REPORT*  Clinical Data: Abdominal pain  CT ABDOMEN AND PELVIS WITH CONTRAST  Technique:  Multidetector CT imaging of the abdomen and pelvis was performed following the standard protocol during bolus administration of intravenous contrast.  Contrast: 80mL OMNIPAQUE IOHEXOL 300 MG/ML  SOLN  Comparison: None.  Findings:  The lung bases appear clear.  No pleural or pericardial effusion. A focal area of low attenuation within the left hepatic lobe measures 7 x 9 mm, image 11/series 2.  Within the right hepatic lobe there is a 7 mm low attenuation structure.  These are both too small to reliably characterize.  The gallbladder appears within normal limits.  No biliary dilatation.  Normal appearance of the pancreas.  The spleen is normal.  The adrenal glands both appear normal.  Bilateral renal cortical thinning is noted.  Small cyst within the inferior pole of the right kidney  measures 6 mm and is too small to reliably characterize.  No obstructive uropathy identified.  The urinary bladder appears normal.  The uterus and the adnexal structures have a normal physiologic appearance for patient's age. No free fluid or fluid collection within the abdomen or pelvis.  The abdominal aorta has a normal caliber.  There is mild calcified atherosclerotic change noted.  No upper abdominal adenopathy. There is no pelvic or inguinal adenopathy noted.  The stomach appears normal.  The small bowel loops have a normal course and caliber without evidence for bowel obstruction.  The appendix is  visualized and appears normal.  Normal appearance of the colon.  Review of the visualized osseous structures is significant for compression deformity involving the L1 vertebral body. This is similar to 07/13/2008.  IMPRESSION:  1.  No acute findings identified within the abdomen or the pelvis. 2.  Bilateral renal cortical thinning. 3.  Low attenuation structures within the liver parenchyma are too small to characterize.   Original Report Authenticated By: Signa Kell, M.D.    EKG: Independently reviewed.  Assessment/Plan Principal Problem:   Adult failure to thrive Active Problems:   Dementia   Protein calorie malnutrition   Mental status change   1. Adult failure to thrive - will continue to perform CT head given the recent mental status change but I am highly concerned that this represents dehydration due to end stage dementia and poor PO intake.  At this point the only further infectious work up planned includes BC for temp greater than 100.4.  Discussed a possible LP with daughter but given the low yield of this in light of the lack of obvious neurologic or meningeal findings or clear findings of sepsis she declined (I agree that this test would be very low yield and likely unnecessarily invasive).  IVF at this point for hydration, with D5.  Checking phos and mag levels given poor PO intake,  replace electrolytes as needed.    Code Status: DNR/DNI (must indicate code status--if unknown or must be presumed, indicate so) Family Communication: Spoke with daughter at bedside (indicate person spoken with, if applicable, with phone number if by telephone) Disposition Plan: Admit to obs (indicate anticipated LOS)  Time spent: 70 min  Camilo Mander M. [facility] Pager [phone]  If 7PM-7AM, please contact night-coverage [email or web address] Password [facility] 12/23/2012, 8:51 PM

## 2012-12-24 DIAGNOSIS — I10 Essential (primary) hypertension: Secondary | ICD-10-CM

## 2012-12-24 DIAGNOSIS — E876 Hypokalemia: Secondary | ICD-10-CM

## 2012-12-24 DIAGNOSIS — R1310 Dysphagia, unspecified: Secondary | ICD-10-CM

## 2012-12-24 DIAGNOSIS — F329 Major depressive disorder, single episode, unspecified: Secondary | ICD-10-CM

## 2012-12-24 LAB — BASIC METABOLIC PANEL
CO2: 27 mEq/L (ref 19–32)
Calcium: 8.3 mg/dL — ABNORMAL LOW (ref 8.4–10.5)
Chloride: 109 mEq/L (ref 96–112)
Glucose, Bld: 70 mg/dL (ref 70–99)
Potassium: 3.2 mEq/L — ABNORMAL LOW (ref 3.5–5.1)
Sodium: 143 mEq/L (ref 135–145)

## 2012-12-24 LAB — CBC
Hemoglobin: 11.3 g/dL — ABNORMAL LOW (ref 12.0–15.0)
MCH: 28 pg (ref 26.0–34.0)
Platelets: 131 10*3/uL — ABNORMAL LOW (ref 150–400)
RBC: 4.04 MIL/uL (ref 3.87–5.11)
WBC: 5.8 10*3/uL (ref 4.0–10.5)

## 2012-12-24 LAB — URINE CULTURE
Colony Count: NO GROWTH
Culture: NO GROWTH

## 2012-12-24 MED ORDER — POTASSIUM CHLORIDE CRYS ER 20 MEQ PO TBCR
40.0000 meq | EXTENDED_RELEASE_TABLET | Freq: Once | ORAL | Status: AC
  Administered 2012-12-24: 40 meq via ORAL
  Filled 2012-12-24: qty 2

## 2012-12-24 MED ORDER — ENSURE COMPLETE PO LIQD
237.0000 mL | Freq: Three times a day (TID) | ORAL | Status: DC
  Administered 2012-12-24 – 2012-12-27 (×6): 237 mL via ORAL

## 2012-12-24 MED ORDER — ADULT MULTIVITAMIN W/MINERALS CH
1.0000 | ORAL_TABLET | Freq: Every day | ORAL | Status: DC
  Administered 2012-12-24 – 2012-12-27 (×4): 1 via ORAL
  Filled 2012-12-24 (×4): qty 1

## 2012-12-24 NOTE — Progress Notes (Signed)
[facility] PROGRESS NOTE  Krista [name] JYN:[id number] DOB: [birth year] DOA: 12/23/2012 PCP: Eustaquio Boyden, MD  Assessment/Plan: 1. Adult failure to thrive - consult speech therapist given history of poor oral intake and dementia - consulted dietitian - routine AMS lab work see orders for details - reassess next am - CT of head reported as no acute intracranial abnormality, CT of abdomen and pelvis reported as no acute findings, urinalysis negative, Pt afebrile in the last 24 hours.  2. Dementia - most likely contributing or leading to # 1 - Please see above plan  3. Hypokalemia - replace orally if able to tolerate po intake. - reassess next am.  4. Protein calorie malnutrition - consulted dietitian - most likely due to poor oral intake most likely 2ary to worsening dementia.   Code Status: DNR Family Communication: no family at bedside.  Disposition Plan: Pending further evaluation and recommendations as well as lab tests. If poor oral intake 2ary to non reversible cause (like progressive dementia) will consider consulting palliative care for GOC  Consultants:  None  Procedures:  None  Antibiotics:  None  HPI/Subjective: Patient does not interact verbally with examiner but gazes at me and will nod head in response to some questions.  Objective: Filed Vitals:   12/24/12 0545  BP: 133/65  Pulse: 59  Temp: 98.1 F (36.7 C)  Resp: 18    Intake/Output Summary (Last 24 hours) at 12/24/12 1251 Last data filed at 12/24/12 0900  Gross per 24 hour  Intake    120 ml  Output      0 ml  Net    120 ml   Filed Weights   12/23/12 2200  Weight: 49.442 kg (109 lb)    Exam:   General:  Pt in NAD, Alert and awake. Limited interaction with examiner  Cardiovascular: RRR, no MRG  Respiratory: CTA BL, no increased WOB  Abdomen: soft, NT, ND  Musculoskeletal: no cyanosis   Data Reviewed: Basic Metabolic Panel:  Recent Labs Lab 12/23/12 1611  12/24/12 0415  NA 146* 143  K 3.0* 3.2*  CL 108 109  CO2 30 27  GLUCOSE 86 70  BUN 23 15  CREATININE 1.04 0.74  CALCIUM 9.1 8.3*  MG 2.0  --   PHOS 3.5  --    Liver Function Tests:  Recent Labs Lab 12/23/12 1611  AST 56*  ALT 25  ALKPHOS 50  BILITOT 0.4  PROT 6.0  ALBUMIN 3.0*   No results found for this basename: LIPASE, AMYLASE,  in the last 168 hours No results found for this basename: AMMONIA,  in the last 168 hours CBC:  Recent Labs Lab 12/23/12 1611 12/24/12 0415  WBC 5.9 5.8  NEUTROABS 3.7  --   HGB 12.7 11.3*  HCT 39.1 34.9*  MCV 87.7 86.4  PLT 158 131*   Cardiac Enzymes: No results found for this basename: CKTOTAL, CKMB, CKMBINDEX, TROPONINI,  in the last 168 hours BNP (last 3 results) No results found for this basename: PROBNP,  in the last 8760 hours CBG: No results found for this basename: GLUCAP,  in the last 168 hours  No results found for this or any previous visit (from the past 240 hour(s)).   Studies: Ct Head Wo Contrast  12/23/2012   *RADIOLOGY REPORT*  Clinical Data: Demented.  Altered mental status.  History of prior urinary tract infection.  CT HEAD WITHOUT CONTRAST  Technique:  Contiguous axial images were obtained from the base of the skull through  the vertex without contrast.  Comparison: 10/09/2012.  Findings: No mass lesion, mass effect, midline shift, hydrocephalus, hemorrhage.  No acute territorial cortical ischemia/infarct. Atrophy and chronic ischemic white matter disease is present.  IMPRESSION: Atrophy and chronic ischemic white matter disease without acute intracranial abnormality.   Original Report Authenticated By: Andreas Newport, M.D.   Ct Abdomen Pelvis W Contrast  12/23/2012   *RADIOLOGY REPORT*  Clinical Data: Abdominal pain  CT ABDOMEN AND PELVIS WITH CONTRAST  Technique:  Multidetector CT imaging of the abdomen and pelvis was performed following the standard protocol during bolus administration of intravenous contrast.   Contrast: 80mL OMNIPAQUE IOHEXOL 300 MG/ML  SOLN  Comparison: None.  Findings:  The lung bases appear clear.  No pleural or pericardial effusion. A focal area of low attenuation within the left hepatic lobe measures 7 x 9 mm, image 11/series 2.  Within the right hepatic lobe there is a 7 mm low attenuation structure.  These are both too small to reliably characterize.  The gallbladder appears within normal limits.  No biliary dilatation.  Normal appearance of the pancreas.  The spleen is normal.  The adrenal glands both appear normal.  Bilateral renal cortical thinning is noted.  Small cyst within the inferior pole of the right kidney measures 6 mm and is too small to reliably characterize.  No obstructive uropathy identified.  The urinary bladder appears normal.  The uterus and the adnexal structures have a normal physiologic appearance for patient's age. No free fluid or fluid collection within the abdomen or pelvis.  The abdominal aorta has a normal caliber.  There is mild calcified atherosclerotic change noted.  No upper abdominal adenopathy. There is no pelvic or inguinal adenopathy noted.  The stomach appears normal.  The small bowel loops have a normal course and caliber without evidence for bowel obstruction.  The appendix is visualized and appears normal.  Normal appearance of the colon.  Review of the visualized osseous structures is significant for compression deformity involving the L1 vertebral body. This is similar to 07/13/2008.  IMPRESSION:  1.  No acute findings identified within the abdomen or the pelvis. 2.  Bilateral renal cortical thinning. 3.  Low attenuation structures within the liver parenchyma are too small to characterize.   Original Report Authenticated By: Signa Kell, M.D.    Scheduled Meds: . atorvastatin  80 mg Oral Daily  . divalproex  250 mg Oral TID  . feeding supplement  237 mL Oral Daily  . heparin  5,000 Units Subcutaneous Q8H  . levothyroxine  25 mcg Oral QAC  breakfast  . pramipexole  0.25 mg Oral QHS  . sertraline  50 mg Oral Daily   Continuous Infusions: . dextrose 5 % and 0.45% NaCl 100 mL/hr at 12/24/12 1050    Principal Problem:   Adult failure to thrive Active Problems:   Dementia   Protein calorie malnutrition   Mental status change    Time spent: > 35 minutes    Penny Pia  [facility] Pager [phone]. If 7PM-7AM, please contact night-coverage at [email or web address], password [facility] 12/24/2012, 12:51 PM  LOS: 1 day

## 2012-12-24 NOTE — Progress Notes (Signed)
INITIAL NUTRITION ASSESSMENT  Pt meets criteria for severe MALNUTRITION in the context of chronic illness as evidenced by <75% estimated energy intake in the past month with 12% weight loss in the past 2 months.  DOCUMENTATION CODES Per approved criteria  -Severe malnutrition in the context of chronic illness   INTERVENTION: - Continue Ensure Complete, will increase to TID - Multivitamin 1 tablet PO daily - Will continue to monitor   NUTRITION DIAGNOSIS: Unintended weight loss related to poor intake, dysphagia, dementia as evidenced by weight trend.   Goal: Pt able to safely consume >90% of meals/supplements.   Monitor:  Weights, labs, intake  Reason for Assessment: Consult   72 y.o. female  Admitting Dx: Adult failure to thrive  ASSESSMENT: Pt from home where her daughter reported pt not eating/drinking for 36 hours PTA. Pt with diarrhea on admission. Pt nonverbal for EMS and pt with dementia. Pt refused to eat for 2 days PTA. RN reported family stated pt orally holding foods and medicines.    S/p bedside swallow evaluation today with SLP noting pt to be at moderate aspiration risk. Full liquid diet with strict precautions was recommended. Attempted to meet with pt, however pt asleep. Noted pt's weight down 15 pounds in the past 2 months. Per nutrition screen, pt has lost 65 pounds in the past 2 years. RN reports pt enjoys popsicles and Ensure. Noted pt with low potassium and elevated AST. Pt getting IV and oral KCl.   Height: Ht Readings from Last 1 Encounters:  12/23/12 5\' 4"  (1.626 m)    Weight: Wt Readings from Last 1 Encounters:  12/23/12 109 lb (49.442 kg)    Ideal Body Weight: 120 lb   % Ideal Body Weight: 91%  Wt Readings from Last 10 Encounters:  12/23/12 109 lb (49.442 kg)  11/14/12 118 lb 4 oz (53.638 kg)  10/10/12 124 lb 5.4 oz (56.4 kg)    Usual Body Weight: 124 lb in June 2014  % Usual Body Weight: 88%  BMI:  Body mass index is 18.7  kg/(m^2).  Estimated Nutritional Needs: Kcal: 1500-1700 Protein: 60-75g Fluid: 1.5-1.7L/day  Skin: Intact   Diet Order: Full Liquid  EDUCATION NEEDS: -No education needs identified at this time   Intake/Output Summary (Last 24 hours) at 12/24/12 1251 Last data filed at 12/24/12 0900  Gross per 24 hour  Intake    120 ml  Output      0 ml  Net    120 ml    Last BM: 8/20 diarrhea  Labs:   Recent Labs Lab 12/23/12 1611 12/24/12 0415  NA 146* 143  K 3.0* 3.2*  CL 108 109  CO2 30 27  BUN 23 15  CREATININE 1.04 0.74  CALCIUM 9.1 8.3*  MG 2.0  --   PHOS 3.5  --   GLUCOSE 86 70    CBG (last 3)  No results found for this basename: GLUCAP,  in the last 72 hours  Scheduled Meds: . atorvastatin  80 mg Oral Daily  . divalproex  250 mg Oral TID  . feeding supplement  237 mL Oral Daily  . heparin  5,000 Units Subcutaneous Q8H  . levothyroxine  25 mcg Oral QAC breakfast  . pramipexole  0.25 mg Oral QHS  . sertraline  50 mg Oral Daily    Continuous Infusions: . dextrose 5 % and 0.45% NaCl 100 mL/hr at 12/24/12 1050    Past Medical History  Diagnosis Date  . Hypertension   .  Hypothyroidism   . HLD (hyperlipidemia)   . Osteoporosis   . Dementia   . Vitamin D deficiency   . RLS (restless legs syndrome)   . Allergic rhinitis   . Incontinence     Bowel and bladder  . Hx: UTI (urinary tract infection)   . History of asthma   . Depression   . GAD (generalized anxiety disorder)     Past Surgical History  Procedure Laterality Date  . Mri/mra  2010    WNL  . Sleep study  2010  . Dexa  11/2011    f/u 2 yrs?  . Dexa  06/2009    Tscore spine -0.58, hip -2.67    Levon Hedger MS, RD, LDN [phone] Pager [phone] After Hours Pager

## 2012-12-24 NOTE — Evaluation (Signed)
Physical Therapy Evaluation Patient Details Name: Krista [name] MRN: [id number] DOB: June 16, 1940 Today's Date: 12/24/2012 Time: 7846-9629 PT Time Calculation (min): 16 min  PT Assessment / Plan / Recommendation History of Present Illness  Krista [name] is a 72 y.o. female who presents with several day history of not eating nor drinking.  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   Clinical Impression  Although pt is nonverbal and prefers to keep UE, LE and neck flexed, pt is able to achieve full ROM with gentle stretching.  Once up on her feet, she is able to find her balance and advance her feet to walk.  Feel that she has potential to return to some functional mobility with continued PT   PT Assessment  All further PT needs can be met in the next venue of care    Follow Up Recommendations  SNF    Does the patient have the potential to tolerate intense rehabilitation      Barriers to Discharge        Equipment Recommendations  None recommended by PT    Recommendations for Other Services     Frequency      Precautions / Restrictions Precautions Precautions: Fall   Pertinent Vitals/Pain No c/o pain      Mobility  Bed Mobility Bed Mobility: Rolling Right;Rolling Left;Supine to Sit;Sit to Supine Rolling Right: 2: Max assist Rolling Left: 2: Max assist Supine to Sit: 2: Max assist Sit to Supine: 2: Max assist Details for Bed Mobility Assistance: pt prefers to stay with hips, knees flexed and adducted, arms folded across body Transfers Transfers: Sit to Stand;Stand to Sit Sit to Stand: 1: +2 Total assist Sit to Stand: Patient Percentage: 30% Stand to Sit: 1: +2 Total assist Stand to Sit: Patient Percentage: 30% Details for Transfer Assistance: pt needs total assist to come to stand, once up, she is able to maintain position Ambulation/Gait Ambulation/Gait Assistance: 1: +2 Total  assist Ambulation/Gait: Patient Percentage: 50% Ambulation Distance (Feet): 25 Feet Assistive device: Rolling walker Ambulation/Gait Assistance Details: assist to advace RW and provide forward momentum Gait Pattern: Step-through pattern;Decreased step length - right;Decreased step length - left;Trunk flexed Gait velocity: decreased General Gait Details: pt grasps tightly onto RW, but is able to move feet once she is in upright position. Stairs: No Wheelchair Mobility Wheelchair Mobility: No    Exercises Other Exercises Other Exercises: pt unable to tolerate ROM or exercise as she tends to pull back up into flexion   PT Diagnosis: Difficulty walking;Abnormality of gait  PT Problem List: Decreased mobility;Decreased activity tolerance;Decreased knowledge of use of DME;Decreased safety awareness;Decreased balance;Decreased cognition PT Treatment Interventions:       PT Goals(Current goals can be found in the care plan section) Acute Rehab PT Goals Patient Stated Goal: none stated PT Goal Formulation: Patient unable to participate in goal setting Time For Goal Achievement: 01/07/13 Potential to Achieve Goals: Good  Visit Information  Last PT Received On: 12/24/12 Assistance Needed: +2 History of Present Illness: Krista [name] is a 72 y.o. female who presents with several day history of not eating nor drinking.  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        Prior Functioning  Home Living Family/patient expects to be discharged to::  (looking at inpatient respite care for  awhile) Living Arrangements: Children (daughter) Prior Function Level of Independence: Needs assistance    Cognition  Cognition Arousal/Alertness:  (needed stimulation to wake up) Behavior During Therapy: Flat affect (pt nonverbal) Overall Cognitive Status: Impaired/Different from baseline (hx of end stage dementia)     Extremity/Trunk Assessment Lower Extremity Assessment Lower Extremity Assessment: Difficult to assess due to impaired cognition (can achieve full ROM with gentle stretch prefers flexion) Cervical / Trunk Assessment Cervical / Trunk Assessment:  (prefers cerv . flexion)   Balance Balance Balance Assessed: Yes Static Sitting Balance Static Sitting - Balance Support: No upper extremity supported;Feet supported Static Sitting - Level of Assistance: 4: Min Oncologist Standing - Balance Support: Bilateral upper extremity supported Static Standing - Level of Assistance: 3: Mod assist  End of Session PT - End of Session Equipment Utilized During Treatment: Gait belt Activity Tolerance: Patient tolerated treatment well Patient left: in chair  GP Functional Assessment Tool Used: clinical judgement Functional Limitation: Mobility: Walking and moving around Mobility: Walking and Moving Around Current Status [phone]): At least 80 percent but less than 100 percent impaired, limited or restricted Mobility: Walking and Moving Around Goal Status [phone]): At least 20 percent but less than 40 percent impaired, limited or restricted   Donnetta Hail 12/24/2012, 11:15 AM

## 2012-12-24 NOTE — Progress Notes (Addendum)
Clinical Social Work Department CLINICAL SOCIAL WORK PLACEMENT NOTE 12/24/2012  Patient:  Krista [name], Krista [name]  Account Number:  [id number] Admit date:  12/23/2012  Clinical Social Worker:  Unk Lightning, LCSW  Date/time:  12/24/2012 02:00 PM  Clinical Social Work is seeking post-discharge placement for this patient at the following level of care:   SKILLED NURSING   (*CSW will update this form in Epic as items are completed)   12/24/2012  Patient/family provided with [facility] Department of Clinical Social Work's list of facilities offering this level of care within the geographic area requested by the patient (or if unable, by the patient's family).  12/24/2012  Patient/family informed of their freedom to choose among providers that offer the needed level of care, that participate in Medicare, Medicaid or managed care program needed by the patient, have an available bed and are willing to accept the patient.  12/24/2012  Patient/family informed of [facility]' ownership interest in [facility] as well as of the fact that they are under no obligation to receive care at this facility.  PASARR submitted to EDS on 12/24/2012 PASARR number received from EDS on 12/24/2012  FL2 transmitted to all facilities in geographic area requested by pt/family on  12/24/2012 FL2 transmitted to all facilities within larger geographic area on   Patient informed that his/her managed care company has contracts with or will negotiate with  certain facilities, including the following:     Patient/family informed of bed offers received:  12/25/12 Patient chooses bed at [facility] Physician recommends and patient chooses bed at    Patient to be transferred to  on   Patient to be transferred to facility by   The following physician request were entered in Epic:   Additional Comments:

## 2012-12-24 NOTE — Evaluation (Signed)
Clinical/Bedside Swallow Evaluation Patient Details  Name: Krista [name] MRN: [id number] Date of Birth: 03-02-1941  Today's Date: 12/24/2012 Time: 1130-1150 SLP Time Calculation (min): 20 min  Past Medical History:  Past Medical History  Diagnosis Date  . Hypertension   . Hypothyroidism   . HLD (hyperlipidemia)   . Osteoporosis   . Dementia   . Vitamin D deficiency   . RLS (restless legs syndrome)   . Allergic rhinitis   . Incontinence     Bowel and bladder  . Hx: UTI (urinary tract infection)   . History of asthma   . Depression   . GAD (generalized anxiety disorder)    Past Surgical History:  Past Surgical History  Procedure Laterality Date  . Mri/mra  2010    WNL  . Sleep study  2010  . Dexa  11/2011    f/u 2 yrs?  . Dexa  06/2009    Tscore spine -0.58, hip -2.67   HPI:  72 yo admitted with no po intake x3 days, pt resides with family and has end stage dementia.  She is nonverbal, was febrile upon admit and has h/o RLS, depression, anxiety, rhinitis, HTN, HLD, asthma.  Pt was placed on regular diet but changed to full liquid as RN stated family reported difficulty with pt orally holding foods, medicines etc.  Pt CT head negative, lungs are CTA,  WBC 5.9.  Bedside swallow evaluation ordered.    Assessment / Plan / Recommendation Clinical Impression  Pt presents with dysphagia consistent with findings of pt's with dementia.  Pt did not follow directions for oral motor exam and only verbalized once during session.  Decreased awareness to bolus results in oral holding, delayed transiting noted.   SLP bringing next bolus toward oral cavity helped pt to trigger a swallow due to neurological  hierarchy.  No s/s of aspiration but pt demonstrated ineffective/ineffienct mastication of solids.  Rec continue full liquids with full supervision, assuring pt swallows before placing more in her mouth.  Pt was able to self feed icecream and Ensure/water but continued with delays.  Pt at one  point held spoon in her mouth and would not open for slp to remove it.  She demonstrates difficulty releasing items - most notably from her hands.  Suspect adequate intake may be challenging for pt to obtain given level of cognitive based dysphagia.  SLP to follow up for family education.      Aspiration Risk  Moderate    Diet Recommendation  (full liquid with strict precatuions)   Liquid Administration via: Cup;Straw Medication Administration: Whole meds with puree Supervision: Patient able to self feed;Full supervision/cueing for compensatory strategies Compensations: Slow rate;Small sips/bites Postural Changes and/or Swallow Maneuvers: Seated upright 90 degrees;Upright 30-60 min after meal    Other  Recommendations Oral Care Recommendations: Oral care BID (as pt allows)   Follow Up Recommendations    TBD   Frequency and Duration min 2x/week  1 week   Pertinent Vitals/Pain Afebrile, decreased    SLP Swallow Goals Patient will utilize recommended strategies during swallow to increase swallowing safety with: Total assistance Goal #3: Family will state compensation strategies to mitigate asp risk and improve swallow effiency with max assist.    Swallow Study Prior Functional Status   unknown, lives with family, no family present    General HPI: 72 yo admitted with no po intake x3 days, pt resides with family and has end stage dementia.  She is nonverbal, was febrile upon admit  and has h/o RLS, depression, anxiety, rhinitis, HTN, HLD, asthma.  Pt was placed on regular diet but changed to full liquid as RN stated family reported difficulty with pt orally holding foods, medicines etc.  Pt CT head negative, lungs are CTA,  WBC 5.9.  Bedside swallow evaluation ordered.     Oral/Motor/Sensory Function Overall Oral Motor/Sensory Function: Appears within functional limits for tasks assessed (pt did not follow commands, sealed lips on straw, spoon) Velum:  (DNT) Mandible:  (DNT)   Ice  Chips Ice chips: Not tested   Thin Liquid Thin Liquid: Impaired Presentation: Cup;Straw Oral Phase Impairments: Poor awareness of bolus;Impaired anterior to posterior transit;Reduced lingual movement/coordination Oral Phase Functional Implications: Prolonged oral transit;Oral holding Pharyngeal  Phase Impairments: Suspected delayed Swallow    Nectar Thick Nectar Thick Liquid: Impaired Presentation: Self Fed;Cup;Straw Oral Phase Impairments: Impaired anterior to posterior transit;Reduced lingual movement/coordination;Poor awareness of bolus Oral phase functional implications: Oral holding;Prolonged oral transit Pharyngeal Phase Impairments: Suspected delayed Swallow   Honey Thick Honey Thick Liquid: Not tested   Puree Puree: Impaired Presentation: Self Fed;Spoon Oral Phase Impairments: Reduced lingual movement/coordination;Impaired anterior to posterior transit;Poor awareness of bolus Oral Phase Functional Implications: Oral holding;Prolonged oral transit Pharyngeal Phase Impairments: Suspected delayed Swallow   Solid          GO Functional Assessment Tool Used: clinical judgement Functional Limitations: Swallowing Swallow Current Status (U1324): At least 40 percent but less than 60 percent impaired, limited or restricted Swallow Goal Status [phone]): At least 20 percent but less than 40 percent impaired, limited or restricted  Solid: Impaired Presentation: Self Fed Oral Phase Impairments: Impaired anterior to posterior transit;Reduced lingual movement/coordination;Poor awareness of bolus Oral Phase Functional Implications: Oral residue;Oral holding (oral residuals, decreased mastication) Pharyngeal Phase Impairments: Suspected delayed Swallow Other Comments: use of icecream facilitated oral clearance       Donavan Burnet, MS [facility] SLP [phone]

## 2012-12-24 NOTE — Progress Notes (Signed)
Patient took her morning medications with applesauce and strawberry ensure.  Tolerated these items without issue.  Continues to be nonverbal.  Patient's daughter Bjorn Loser phoned to check on her mothers status and states she has health problems of her own to fully care for her mother at this time.  She states she would like to consider SNF placement if she is found to need this increase in care.  Relayed this information to [place] CSW.  In addition to above information a nonrelative neighborhood resident visited and states that she does not feel this patient should be allowed to return home due to the inability of caregiver to meet the needs of this person.  She also would like to remain anonymous.

## 2012-12-24 NOTE — Progress Notes (Signed)
Clinical Social Work Department BRIEF PSYCHOSOCIAL ASSESSMENT 12/24/2012  Patient:  GRACIELLA, ARMENT     Account Number:  [id number]     Admit date:  12/23/2012  Clinical Social Worker:  Dennison Bulla  Date/Time:  12/24/2012 02:00 PM  Referred by:  Physician  Date Referred:  12/24/2012 Referred for  SNF Placement   Other Referral:   Interview type:  Family Other interview type:    PSYCHOSOCIAL DATA Living Status:  FAMILY Admitted from facility:   Level of care:   Primary support name:  Rhonda Primary support relationship to patient:  CHILD, ADULT Degree of support available:   Strong    CURRENT CONCERNS Current Concerns  Post-Acute Placement   Other Concerns:    SOCIAL WORK ASSESSMENT / PLAN CSW received referral in order to assist with DC planning. CSW reviewed chart which stated that PT recommends SNF placement. CSW spoke with CM who confirmed inpatient status as of 12/24/12. CSW attempted to meet with patient but patient is disoriented.    CSW spoke with dtr Bjorn Loser) via phone who reports she has been caring for patient but is having physical problems herself. Dtr reports she is unsure if she can care for patient and interested in SNF placement. CSW explained Medicare benefits along with inpatient qualifying stay needed. CSW spoke about SNF process and left SNF list in room for dtr. Dtr agreeable to [facility] search. Dtr has CSW contact information and will contact CSW with further questions.    CSW completed FL2 and faxed out to [facility]. CSW will follow up with bed offers.   Assessment/plan status:  Psychosocial Support/Ongoing Assessment of Needs Other assessment/ plan:   Information/referral to community resources:   SNF list    PATIENT'S/FAMILY'S RESPONSE TO PLAN OF CARE: Patient unable to participate in assessment. Patient's dtr engaged and agreeable to SNF search. Dtr asked appropriate questions and feels that ST SNF is needed prior to return home.  Dtr agreeable to CSW follow up.

## 2012-12-25 DIAGNOSIS — E039 Hypothyroidism, unspecified: Secondary | ICD-10-CM

## 2012-12-25 DIAGNOSIS — A0472 Enterocolitis due to Clostridium difficile, not specified as recurrent: Secondary | ICD-10-CM

## 2012-12-25 DIAGNOSIS — E43 Unspecified severe protein-calorie malnutrition: Secondary | ICD-10-CM | POA: Insufficient documentation

## 2012-12-25 LAB — BASIC METABOLIC PANEL
BUN: 9 mg/dL (ref 6–23)
Chloride: 104 mEq/L (ref 96–112)
Glucose, Bld: 89 mg/dL (ref 70–99)
Potassium: 2.8 mEq/L — ABNORMAL LOW (ref 3.5–5.1)

## 2012-12-25 LAB — CLOSTRIDIUM DIFFICILE BY PCR: Toxigenic C. Difficile by PCR: POSITIVE — AB

## 2012-12-25 MED ORDER — METRONIDAZOLE 500 MG PO TABS
500.0000 mg | ORAL_TABLET | Freq: Three times a day (TID) | ORAL | Status: DC
  Administered 2012-12-25 – 2012-12-27 (×6): 500 mg via ORAL
  Filled 2012-12-25 (×9): qty 1

## 2012-12-25 MED ORDER — POTASSIUM CHLORIDE 10 MEQ/100ML IV SOLN
10.0000 meq | INTRAVENOUS | Status: AC
  Administered 2012-12-25 (×4): 10 meq via INTRAVENOUS
  Filled 2012-12-25 (×4): qty 100

## 2012-12-25 NOTE — Progress Notes (Signed)
[facility] PROGRESS NOTE  Krista [name] ZOX:[id number] DOB: 1940-05-19 DOA: 12/23/2012 PCP: Eustaquio Boyden, MD  Assessment/Plan: 1. Adult failure to thrive - consult speech therapist given history of poor oral intake and dementia - consulted dietitian - routine AMS lab work see orders for details - reassess next am - CT of head reported as no acute intracranial abnormality, CT of abdomen and pelvis reported as no acute findings, urinalysis negative, Pt afebrile in the last 24 hours. - C diff positive and may be contributing to 1.  Will add   2. Dementia - most likely contributing or leading to # 1 - Please see above plan  3. Hypokalemia - replace orally if able to tolerate po intake. - reassess next am.  4. Protein calorie malnutrition - consulted dietitian - most likely due to poor oral intake most likely 2ary to worsening dementia.  5. C diff + - Will add flagyl at this juncture - May be contributing to poor oral intake and AMS. -reassess next am.  Code Status: DNR Family Communication: no family at bedside.  Disposition Plan: Pending improvement in mentation to baseline and po intake  Consultants:  None  Procedures:  None  Antibiotics:  None  HPI/Subjective: Patient does not interact verbally with examiner but gazes at me and will nod head in response to some questions.  Objective: Filed Vitals:   12/25/12 1343  BP: 144/70  Pulse: 70  Temp: 98.1 F (36.7 C)  Resp: 18    Intake/Output Summary (Last 24 hours) at 12/25/12 1420 Last data filed at 12/25/12 0900  Gross per 24 hour  Intake 3518.33 ml  Output      0 ml  Net 3518.33 ml   Filed Weights   12/23/12 2200  Weight: 49.442 kg (109 lb)    Exam:   General:  Pt in NAD, Alert and awake. Limited interaction with examiner  Cardiovascular: RRR, no MRG  Respiratory: CTA BL, no increased WOB  Abdomen: soft, NT, ND  Musculoskeletal: no cyanosis   Data Reviewed: Basic Metabolic  Panel:  Recent Labs Lab 12/23/12 1611 12/24/12 0415 12/25/12 0430  NA 146* 143 138  K 3.0* 3.2* 2.8*  CL 108 109 104  CO2 30 27 26   GLUCOSE 86 70 89  BUN 23 15 9   CREATININE 1.04 0.74 0.71  CALCIUM 9.1 8.3* 8.2*  MG 2.0  --   --   PHOS 3.5  --   --    Liver Function Tests:  Recent Labs Lab 12/23/12 1611  AST 56*  ALT 25  ALKPHOS 50  BILITOT 0.4  PROT 6.0  ALBUMIN 3.0*   No results found for this basename: LIPASE, AMYLASE,  in the last 168 hours No results found for this basename: AMMONIA,  in the last 168 hours CBC:  Recent Labs Lab 12/23/12 1611 12/24/12 0415  WBC 5.9 5.8  NEUTROABS 3.7  --   HGB 12.7 11.3*  HCT 39.1 34.9*  MCV 87.7 86.4  PLT 158 131*   Cardiac Enzymes: No results found for this basename: CKTOTAL, CKMB, CKMBINDEX, TROPONINI,  in the last 168 hours BNP (last 3 results) No results found for this basename: PROBNP,  in the last 8760 hours CBG: No results found for this basename: GLUCAP,  in the last 168 hours  Recent Results (from the past 240 hour(s))  URINE CULTURE     Status: None   Collection Time    12/23/12  3:58 PM      Result Value  Range Status   Specimen Description URINE, CATHETERIZED   Final   Special Requests NONE   Final   Culture  Setup Time     Final   Value: 12/23/2012 22:53     Performed at [facility] Count     Final   Value: NO GROWTH     Performed at [facility]   Culture     Final   Value: NO GROWTH     Performed at [facility]   Report Status 12/24/2012 FINAL   Final  CLOSTRIDIUM DIFFICILE BY PCR     Status: Abnormal   Collection Time    12/25/12  9:05 AM      Result Value Range Status   C difficile by pcr POSITIVE (*) NEGATIVE Final   Comment: CRITICAL RESULT CALLED TO, READ BACK BY AND VERIFIED WITH:     DENNIS LITTLE,RN AT 1216 12/25/12 BY K BARR     Performed at [facility]     Studies: Ct Head Wo Contrast  12/23/2012   *RADIOLOGY REPORT*  Clinical Data:  Demented.  Altered mental status.  History of prior urinary tract infection.  CT HEAD WITHOUT CONTRAST  Technique:  Contiguous axial images were obtained from the base of the skull through the vertex without contrast.  Comparison: 10/09/2012.  Findings: No mass lesion, mass effect, midline shift, hydrocephalus, hemorrhage.  No acute territorial cortical ischemia/infarct. Atrophy and chronic ischemic white matter disease is present.  IMPRESSION: Atrophy and chronic ischemic white matter disease without acute intracranial abnormality.   Original Report Authenticated By: Andreas Newport, M.D.   Ct Abdomen Pelvis W Contrast  12/23/2012   *RADIOLOGY REPORT*  Clinical Data: Abdominal pain  CT ABDOMEN AND PELVIS WITH CONTRAST  Technique:  Multidetector CT imaging of the abdomen and pelvis was performed following the standard protocol during bolus administration of intravenous contrast.  Contrast: 80mL OMNIPAQUE IOHEXOL 300 MG/ML  SOLN  Comparison: None.  Findings:  The lung bases appear clear.  No pleural or pericardial effusion. A focal area of low attenuation within the left hepatic lobe measures 7 x 9 mm, image 11/series 2.  Within the right hepatic lobe there is a 7 mm low attenuation structure.  These are both too small to reliably characterize.  The gallbladder appears within normal limits.  No biliary dilatation.  Normal appearance of the pancreas.  The spleen is normal.  The adrenal glands both appear normal.  Bilateral renal cortical thinning is noted.  Small cyst within the inferior pole of the right kidney measures 6 mm and is too small to reliably characterize.  No obstructive uropathy identified.  The urinary bladder appears normal.  The uterus and the adnexal structures have a normal physiologic appearance for patient's age. No free fluid or fluid collection within the abdomen or pelvis.  The abdominal aorta has a normal caliber.  There is mild calcified atherosclerotic change noted.  No upper abdominal  adenopathy. There is no pelvic or inguinal adenopathy noted.  The stomach appears normal.  The small bowel loops have a normal course and caliber without evidence for bowel obstruction.  The appendix is visualized and appears normal.  Normal appearance of the colon.  Review of the visualized osseous structures is significant for compression deformity involving the L1 vertebral body. This is similar to 07/13/2008.  IMPRESSION:  1.  No acute findings identified within the abdomen or the pelvis. 2.  Bilateral renal cortical thinning. 3.  Low  attenuation structures within the liver parenchyma are too small to characterize.   Original Report Authenticated By: Signa Kell, M.D.    Scheduled Meds: . atorvastatin  80 mg Oral Daily  . divalproex  250 mg Oral TID  . feeding supplement  237 mL Oral TID WC  . heparin  5,000 Units Subcutaneous Q8H  . levothyroxine  25 mcg Oral QAC breakfast  . metroNIDAZOLE  500 mg Oral Q8H  . multivitamin with minerals  1 tablet Oral Daily  . potassium chloride  10 mEq Intravenous Q1 Hr x 4  . pramipexole  0.25 mg Oral QHS  . sertraline  50 mg Oral Daily   Continuous Infusions: . dextrose 5 % and 0.45% NaCl 100 mL/hr at 12/25/12 0518    Principal Problem:   Adult failure to thrive Active Problems:   Dementia   Protein calorie malnutrition   Mental status change   Hypokalemia   Protein-calorie malnutrition, severe    Time spent: > 35 minutes    Penny Pia  [facility] Pager [phone]. If 7PM-7AM, please contact night-coverage at [email or web address], password [facility] 12/25/2012, 2:20 PM  LOS: 2 days

## 2012-12-25 NOTE — Progress Notes (Signed)
Spoke with patient's daughter regarding isolation due to possible c-diff.  Encouraged to use the protective equipment and to wash hands with soap and water.  She continues to decline to wear gown/gloves.

## 2012-12-25 NOTE — Progress Notes (Signed)
Clinical Social Work  CSW spoke with both dtrs Bjorn Loser and [name]) who reviewed bed offers and chose [facility]. CSW spoke with SNF who is agreeable to admit patient when medically stable. Dtr [name]) to call SNF and to schedule a time to complete paperwork. CSW will continue to follow.  [place], [place] [phone]

## 2012-12-25 NOTE — Progress Notes (Addendum)
Instructed visitor  on isolation precautions regarding protective equipment.

## 2012-12-25 NOTE — Progress Notes (Addendum)
Speech Language Pathology Dysphagia Treatment Patient Details Name: Krista [name] MRN: [id number] DOB: 1941-03-26 Today's Date: 12/25/2012 Time: 4098-1191 SLP Time Calculation (min): 23 min  Assessment / Plan / Recommendation Clinical Impression  Pt seen today for skilled slp intervention to assess tolerance of diet- pt currently asleep and appears comfrotable.    Daughter Cala Bradford in room and was educated to results of clinical swallow evalaution completed yesterday, progression of dysphagia with dementia, clinical reasoning for dietary modifications and compensation strategies to mitigate risk.  Daughter reports pt with dysphagia for 7-8 months - tolerating pureed foods better at home than liquids.  She states pt has bitten down on a utencil and broken her tooth.     Advised daughter to maximize pt's intake when pt is alert and willing- providing liquids/pureed foods that give better sensory input (i.e. cold - carbonated- sweet items).  Daughter expressed concern re: mother not eating and "starving", again educated Cala Bradford to nutritional changes and dysphagia with dementia and research NOT indicating benefit of feeding tubes.  She verbalized understanding to information and expressed appreciation.  SLP did not provide pt with po as she was lethargic.    Poor intake and malnutrition, aspiration risk will wax/wane with medical issues but ultimately progress to pt's dementia.  Hopeful for swallow to improve to baseline as pt medically improves.      Diet Recommendation  Continue with Current Diet:  (full liquids)    SLP Plan Continue with current plan of care   Pertinent Vitals/Pain Afebrile *low grade fever, clear, Cdif +   Swallowing Goals  SLP Swallowing Goals Patient will utilize recommended strategies during swallow to increase swallowing safety with: Total assistance Goal #3: Family will state compensation strategies to mitigate asp risk and improve swallow effiency with max assist.   Swallow Study Goal #3 - Progress: Progressing toward goal  General Temperature Spikes Noted: No Respiratory Status: Room air Behavior/Cognition: Lethargic (asleep)  Oral Cavity - Oral Hygiene Does patient have any of the following "at risk" factors?: Nutritional status - inadequate;Nutritional status - dependent feeder Patient is AT RISK - Oral Care Protocol followed (see row info): Yes   Dysphagia Treatment Treatment focused on: Patient/family/caregiver education Family/Caregiver Educated: daughter Roddie Mc present  Patient observed directly with PO's: No Reason PO's not observed: Lethargic Feeding: Needs set up   GO     Mills Koller, MS [facility] SLP [phone]

## 2012-12-25 NOTE — Progress Notes (Signed)
Clinical Social Work  CSW called dtr to provide offers since no family present at bedside. CSW left a message with CSW contact information. CSW awaiting return call to provide bed offers.  [place], [place] [phone]

## 2012-12-26 DIAGNOSIS — E43 Unspecified severe protein-calorie malnutrition: Secondary | ICD-10-CM

## 2012-12-26 MED ORDER — POTASSIUM CHLORIDE CRYS ER 20 MEQ PO TBCR
40.0000 meq | EXTENDED_RELEASE_TABLET | Freq: Once | ORAL | Status: AC
  Administered 2012-12-26: 40 meq via ORAL
  Filled 2012-12-26: qty 2

## 2012-12-26 MED ORDER — POTASSIUM CHLORIDE 10 MEQ/100ML IV SOLN
10.0000 meq | INTRAVENOUS | Status: AC
  Administered 2012-12-26 (×4): 10 meq via INTRAVENOUS
  Filled 2012-12-26 (×4): qty 100

## 2012-12-26 MED ORDER — METRONIDAZOLE 500 MG PO TABS: 500.0000 mg | ORAL_TABLET | Freq: Three times a day (TID) | ORAL | Status: DC

## 2012-12-26 NOTE — Discharge Summary (Addendum)
Physician Discharge Summary  Krista [name] ZOX:[id number] DOB: February 18, 1941 DOA: 12/23/2012  PCP: Eustaquio Boyden, MD  Admit date: 12/23/2012 Discharge date: 12/26/2012  Time spent: > 35 minutes  Recommendations for Outpatient Follow-up:  1. Patient presented with AMS manifested as decrease in activity level and po intake.  Suspect that this is most likely due to advancing dementia but tested positive for C diff.  If condition does not improve on oral antibiotics please discuss palliative care with family.  Discharge Diagnoses:  Principal Problem:   Adult failure to thrive Active Problems:   Dementia   Protein calorie malnutrition   Mental status change   Hypokalemia   Protein-calorie malnutrition, severe   Enteritis due to Clostridium difficile   Discharge Condition: Stable  Diet recommendation: Full liquid diet with Ensure supplement and multivitamin  Filed Weights   12/23/12 2200  Weight: 49.442 kg (109 lb)    History of present illness:  Patient is a 72 y/o with h/o of advanced dementia who presented to the ED brought by daughter who reported decrease in activity level and oral intake.  Hospital Course:  1. Adult failure to thrive - consult speech therapist given history of poor oral intake and dementia. Currently they are recommending liquid diet - consulted dietitian and patient currently on Ensure supplementation TID - routine AMS lab work was negative - CT of head reported as no acute intracranial abnormality, CT of abdomen and pelvis reported as no acute findings, urinalysis negative, Pt afebrile in the last 24 hours.  - C diff positive and may be contributing to 1. Flagyl added and will continue on discharge to complete a 10 day course.  2. Dementia  - most likely contributing or leading to # 1  - Should patient continue to decline I would recommend initiating palliative services for family.  Would recommend that PCP at SNF discuss with family.  3. Hypokalemia  -  replace orally if able to tolerate po intake.  - Will also replace IV prior to discharge.  4. Protein calorie malnutrition  - most likely due to poor oral intake most likely 2ary to worsening dementia.  - Continue ensure supplementation.  5. C diff +  - Added flagyl at this juncture  - May be contributing to poor oral intake and AMS.  -reassess next am.   Procedures:  none  Consultations:  Speech therapy  Dietitian  Discharge Exam: Filed Vitals:   12/26/12 0700  BP: 165/82  Pulse: 67  Temp: 98.2 F (36.8 C)  Resp: 18    General: Pt in NAD, Alert and Awake Cardiovascular: RRR, no MRG Respiratory: CTA BL, no wheezes Abdomen: ND, NT  Discharge Instructions  Discharge Orders   Future Appointments Provider Department Dept Phone   01/16/2013 12:15 PM Eustaquio Boyden, MD [facility] [phone]   Future Orders Complete By Expires   Diet - low sodium heart healthy  As directed    Discharge instructions  As directed    Comments:     Please continue to follow up with a medical doctor at [facility] where you will transition to.   Increase activity slowly  As directed        Medication List         atorvastatin 80 MG tablet  Commonly known as:  LIPITOR  Take 1 tablet (80 mg total) by mouth daily.     CALCIUM 600 + D PO  Take 1 tablet by mouth daily.     clonazePAM 1  MG tablet  Commonly known as:  KLONOPIN  Take 1 tablet (1 mg total) by mouth 2 (two) times daily as needed for anxiety.     divalproex 125 MG capsule  Commonly known as:  DEPAKOTE SPRINKLE  Take 250 mg by mouth 3 (three) times daily.     feeding supplement Liqd  Take 237 mLs by mouth daily.     levothyroxine 25 MCG tablet  Commonly known as:  SYNTHROID, LEVOTHROID  Take 1 tablet (25 mcg total) by mouth daily before breakfast.     pramipexole 0.25 MG tablet  Commonly known as:  MIRAPEX  Take 1 tablet (0.25 mg total) by mouth at bedtime.     sertraline 50 MG tablet   Commonly known as:  ZOLOFT  Take 50 mg by mouth daily.     traZODone 50 MG tablet  Commonly known as:  DESYREL  Take 50 mg by mouth at bedtime as needed for sleep.     triamcinolone cream 0.1 %  Commonly known as:  KENALOG  Apply 1 application topically 2 (two) times daily as needed (for diaper rash).       Allergies  Allergen Reactions  . Exelon [Rivastigmine Tartrate] Other (See Comments)    Patch caused skin rash  . Sulfa Antibiotics Other (See Comments)    Unknown reaction per pt's daughters      The results of significant diagnostics from this hospitalization (including imaging, microbiology, ancillary and laboratory) are listed below for reference.    Significant Diagnostic Studies: Ct Head Wo Contrast  12/23/2012   *RADIOLOGY REPORT*  Clinical Data: Demented.  Altered mental status.  History of prior urinary tract infection.  CT HEAD WITHOUT CONTRAST  Technique:  Contiguous axial images were obtained from the base of the skull through the vertex without contrast.  Comparison: 10/09/2012.  Findings: No mass lesion, mass effect, midline shift, hydrocephalus, hemorrhage.  No acute territorial cortical ischemia/infarct. Atrophy and chronic ischemic white matter disease is present.  IMPRESSION: Atrophy and chronic ischemic white matter disease without acute intracranial abnormality.   Original Report Authenticated By: Andreas Newport, M.D.   Ct Abdomen Pelvis W Contrast  12/23/2012   *RADIOLOGY REPORT*  Clinical Data: Abdominal pain  CT ABDOMEN AND PELVIS WITH CONTRAST  Technique:  Multidetector CT imaging of the abdomen and pelvis was performed following the standard protocol during bolus administration of intravenous contrast.  Contrast: 80mL OMNIPAQUE IOHEXOL 300 MG/ML  SOLN  Comparison: None.  Findings:  The lung bases appear clear.  No pleural or pericardial effusion. A focal area of low attenuation within the left hepatic lobe measures 7 x 9 mm, image 11/series 2.  Within the  right hepatic lobe there is a 7 mm low attenuation structure.  These are both too small to reliably characterize.  The gallbladder appears within normal limits.  No biliary dilatation.  Normal appearance of the pancreas.  The spleen is normal.  The adrenal glands both appear normal.  Bilateral renal cortical thinning is noted.  Small cyst within the inferior pole of the right kidney measures 6 mm and is too small to reliably characterize.  No obstructive uropathy identified.  The urinary bladder appears normal.  The uterus and the adnexal structures have a normal physiologic appearance for patient's age. No free fluid or fluid collection within the abdomen or pelvis.  The abdominal aorta has a normal caliber.  There is mild calcified atherosclerotic change noted.  No upper abdominal adenopathy. There is no pelvic or inguinal  adenopathy noted.  The stomach appears normal.  The small bowel loops have a normal course and caliber without evidence for bowel obstruction.  The appendix is visualized and appears normal.  Normal appearance of the colon.  Review of the visualized osseous structures is significant for compression deformity involving the L1 vertebral body. This is similar to 07/13/2008.  IMPRESSION:  1.  No acute findings identified within the abdomen or the pelvis. 2.  Bilateral renal cortical thinning. 3.  Low attenuation structures within the liver parenchyma are too small to characterize.   Original Report Authenticated By: Signa Kell, M.D.    Microbiology: Recent Results (from the past 240 hour(s))  URINE CULTURE     Status: None   Collection Time    12/23/12  3:58 PM      Result Value Range Status   Specimen Description URINE, CATHETERIZED   Final   Special Requests NONE   Final   Culture  Setup Time     Final   Value: 12/23/2012 22:53     Performed at [facility] Count     Final   Value: NO GROWTH     Performed at [facility]   Culture     Final   Value: NO  GROWTH     Performed at [facility]   Report Status 12/24/2012 FINAL   Final  CLOSTRIDIUM DIFFICILE BY PCR     Status: Abnormal   Collection Time    12/25/12  9:05 AM      Result Value Range Status   C difficile by pcr POSITIVE (*) NEGATIVE Final   Comment: CRITICAL RESULT CALLED TO, READ BACK BY AND VERIFIED WITH:     DENNIS LITTLE,RN AT 1216 12/25/12 BY K BARR     Performed at [facility]     Labs: Basic Metabolic Panel:  Recent Labs Lab 12/23/12 1611 12/24/12 0415 12/25/12 0430 12/26/12 0832  NA 146* 143 138  --   K 3.0* 3.2* 2.8* 2.8*  CL 108 109 104  --   CO2 30 27 26   --   GLUCOSE 86 70 89  --   BUN 23 15 9   --   CREATININE 1.04 0.74 0.71  --   CALCIUM 9.1 8.3* 8.2*  --   MG 2.0  --   --  1.8  PHOS 3.5  --   --   --    Liver Function Tests:  Recent Labs Lab 12/23/12 1611  AST 56*  ALT 25  ALKPHOS 50  BILITOT 0.4  PROT 6.0  ALBUMIN 3.0*   No results found for this basename: LIPASE, AMYLASE,  in the last 168 hours No results found for this basename: AMMONIA,  in the last 168 hours CBC:  Recent Labs Lab 12/23/12 1611 12/24/12 0415  WBC 5.9 5.8  NEUTROABS 3.7  --   HGB 12.7 11.3*  HCT 39.1 34.9*  MCV 87.7 86.4  PLT 158 131*   Cardiac Enzymes: No results found for this basename: CKTOTAL, CKMB, CKMBINDEX, TROPONINI,  in the last 168 hours BNP: BNP (last 3 results) No results found for this basename: PROBNP,  in the last 8760 hours CBG: No results found for this basename: GLUCAP,  in the last 168 hours     Signed:  Penny Pia  [facility] 12/26/2012, 11:17 AM    Addendum: Patient ok for discharge from my standpoint. Had hypokalemia which was replaced yesterday orally and IV.  Will  reassess levels prior to discharge.  Should have her potassium rechecked at facility.

## 2012-12-26 NOTE — Progress Notes (Signed)
Speech Language Pathology Dysphagia Treatment Patient Details Name: Krista [name] MRN: [id number] DOB: Feb 20, 1941 Today's Date: 12/26/2012 Time: 7846-9629 SLP Time Calculation (min): 21 min  Assessment / Plan / Recommendation Clinical Impression  Pt seen today to assess for readiness for dietary advancement.  Rn reports tolerance of po potassium today - whole!  Pt observed consuming ice cream with crushed medications with RN with inconsistently improved efficiency of swallow response.  Pt also observed feeding herself icecream, applesauce and Ensure without s/s of aspiration.  Intermittent delay in swallow continues up to 31 seconds- rec advance to creamy puree/thin with pt self feeding for maximal airway protection.  SLP to sign off at this time as all goals are met.      Diet Recommendation  Initiate / Change Diet: Dysphagia 1 (puree);Thin liquid    SLP Plan All goals met   Pertinent Vitals/Pain Afebrile, decreased   Swallowing Goals  SLP Swallowing Goals Patient will utilize recommended strategies during swallow to increase swallowing safety with: Total assistance Swallow Study Goal #2 - Progress: Met Goal #3: Family will state compensation strategies to mitigate asp risk and improve swallow effiency with max assist.  Swallow Study Goal #3 - Progress: Met  General Temperature Spikes Noted: No Respiratory Status: Room air Behavior/Cognition: Alert;Confused (pt has dementia) Oral Cavity - Dentition: Adequate natural dentition Patient Positioning: Upright in bed  Oral Cavity - Oral Hygiene Does patient have any of the following "at risk" factors?: Nutritional status - inadequate;Other - dysphagia Patient is AT RISK - Oral Care Protocol followed (see row info): Yes   Dysphagia Treatment Treatment focused on: Skilled observation of diet tolerance;Upgraded PO texture trials Treatment Methods/Modalities: Skilled observation Patient observed directly with PO's: Yes Type of PO's  observed: Dysphagia 1 (puree);Thin liquids Feeding: Able to feed self;Needs set up Liquids provided via: Cup;Straw Oral Phase Signs & Symptoms: Prolonged oral phase;Prolonged bolus formation Pharyngeal Phase Signs & Symptoms: Suspected delayed swallow initiation   GO Functional Assessment Tool Used: clinical judgement Functional Limitations: Swallowing Swallow Goal Status (B2841): At least 20 percent but less than 40 percent impaired, limited or restricted Swallow Discharge Status [phone]): At least 1 percent but less than 20 percent impaired, limited or restricted   Donavan Burnet, MS [facility] SLP [phone]

## 2012-12-26 NOTE — Progress Notes (Signed)
Clinical Social Work  CSW spoke with SNF who is agreeable to accept patient when medically stable. Preliminary DC summary sent to SNF. Dtr [name]) to complete paperwork and aware of possible weekend DC. CSW will leave handoff for weekend CSW.  [place], [place] [phone]

## 2012-12-26 NOTE — Progress Notes (Signed)
Physical Therapy Treatment Patient Details Name: Krista [name] MRN: [id number] DOB: 12-Nov-1940 Today's Date: 12/26/2012 Time: 5784-6962 PT Time Calculation (min): 33 min  PT Assessment / Plan / Recommendation  History of Present Illness Krista [name] is a 72 y.o. female who presents with several day history of not eating nor drinking.  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    PT Comments   Pt is non verbal demon advanced dementia.  Does make eye contact.  Assisted OOB to [facility] with no event.  Assist to amb using Carley Hammed walker for increased support and to increase amb distance and stance time.  Pt tends to curl up in a severe Fetal Position in bed but is able to stand erect for amb.  Pt demon trace initiation with commands and has a tendency to "death grip" on anything near her hands.  Pt required + 2 assist for mobility.    Follow Up Recommendations  SNF     Does the patient have the potential to tolerate intense rehabilitation     Barriers to Discharge        Equipment Recommendations  None recommended by PT    Recommendations for Other Services    Frequency Min 3X/week   Progress towards PT Goals Progress towards PT goals: Progressing toward goals  Plan      Precautions / Restrictions Precautions Precautions: Fall Precaution Comments: advanced dementia (non verbal) Restrictions Weight Bearing Restrictions: No   Pertinent Vitals/Pain     Mobility  Bed Mobility Bed Mobility: Supine to Sit Supine to Sit: 1: +2 Total assist Supine to Sit: Patient Percentage: 20% Details for Bed Mobility Assistance: Pt in bed in fetal position.  Required increased time and + 2 assist to tranfer to EOB. Transfers Transfers: Sit to Stand;Stand to Sit Sit to Stand: 1: +2 Total assist;From bed;From toilet Sit to Stand: Patient Percentage: 30% Stand to Sit: 1: +2 Total assist;To chair/3-in-1;To toilet Stand to  Sit: Patient Percentage: 30% Details for Transfer Assistance: Hand over hand cueing and 100% simple/repeated commands with increased time to process. Pt demon minimal initiation. Pt has a tendency to "death grip" on whatever is near her.  Ambulation/Gait Ambulation/Gait Assistance: 1: +2 Total assist Ambulation Distance (Feet): 80 Feet Assistive device: Eva walker Ambulation/Gait Assistance Details: used Eva walker for increased support and to increase amb distance while decreasing posterior push.  Very small shuffled steps and mod posterior lean.  Pt required 100& assist to advance Carley Hammed walker. Gait Pattern: Decreased step length - right;Decreased step length - left;Trunk flexed;Narrow base of support Gait velocity: decreased     PT Goals (current goals can now be found in the care plan section)    Visit Information  Last PT Received On: 12/26/12 Assistance Needed: +2 History of Present Illness: Krista [name] is a 72 y.o. female who presents with several day history of not eating nor drinking.  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     Subjective Data      Cognition       Balance     End of Session PT - End of Session Equipment Utilized During Treatment: Gait belt Activity Tolerance: Patient tolerated treatment well Patient left: in chair   Felecia Shelling  PTA [facility]  Acute  Rehab Pager  [phone]  

## 2012-12-27 LAB — POTASSIUM: Potassium: 3.6 mEq/L (ref 3.5–5.1)

## 2012-12-27 NOTE — Progress Notes (Signed)
Report called to [facility], foley removed, awaiting PTAR

## 2012-12-27 NOTE — Progress Notes (Signed)
Spoke with Pt's daughter, Cala Bradford.  Cala Bradford to arrange for transportation via PTAR for Pt.  Pt to be d/c'd.  Providence Crosby, LCSWA Clinical Social Work [phone]

## 2012-12-27 NOTE — Progress Notes (Signed)
Per MD, Pt ready for d/c.  Notified facility and Pt's daughter, Mrs. Fredric Mare.  D/c summary sent yesterday.  Confirmed receipt of d/c summary.  Facility ready to receive Pt.  Mrs. Fredric Mare stated that both she and her sister work for [employer] and that they have a particular transporter that they want for their mother.  Mrs. Fredric Mare stated that her sister will be at [place] around lunchtime and that she will make the transportation arrangements with [facility] so that she can arrange for the particular transporter for Pt.  RN to notify CSW when transportation has been arranged.  Providence Crosby, LCSWA Clinical Social Work [phone]

## 2013-01-16 ENCOUNTER — Ambulatory Visit: Payer: Medicare Other | Admitting: Family Medicine

## 2013-02-03 ENCOUNTER — Encounter: Payer: Self-pay | Admitting: Family Medicine

## 2013-02-03 ENCOUNTER — Ambulatory Visit (INDEPENDENT_AMBULATORY_CARE_PROVIDER_SITE_OTHER): Payer: Medicare Other | Admitting: Family Medicine

## 2013-02-03 VITALS — BP 124/84 | HR 80 | Temp 97.8°F | Wt 103.8 lb

## 2013-02-03 DIAGNOSIS — Z23 Encounter for immunization: Secondary | ICD-10-CM

## 2013-02-03 DIAGNOSIS — Z8673 Personal history of transient ischemic attack (TIA), and cerebral infarction without residual deficits: Secondary | ICD-10-CM | POA: Insufficient documentation

## 2013-02-03 DIAGNOSIS — E039 Hypothyroidism, unspecified: Secondary | ICD-10-CM

## 2013-02-03 DIAGNOSIS — F0390 Unspecified dementia without behavioral disturbance: Secondary | ICD-10-CM

## 2013-02-03 DIAGNOSIS — R627 Adult failure to thrive: Secondary | ICD-10-CM

## 2013-02-03 DIAGNOSIS — E43 Unspecified severe protein-calorie malnutrition: Secondary | ICD-10-CM

## 2013-02-03 DIAGNOSIS — I10 Essential (primary) hypertension: Secondary | ICD-10-CM

## 2013-02-03 NOTE — Assessment & Plan Note (Signed)
Recommended start aspirin QD to QOD as tolerated.

## 2013-02-03 NOTE — Assessment & Plan Note (Signed)
Consider d/c TSH with recent weight loss.

## 2013-02-03 NOTE — Patient Instructions (Addendum)
Flu shot today. Let's increase trazodone to 1.5 tablets at bedtime. Let's decrease klonopin - to just one tablet at night time - avoid the am dose. Let's stop depakote. Do one of each change above at a time. Medicare wellness visit next time in 3-4 months. Let's start aspirin 81mg  daily.

## 2013-02-03 NOTE — Assessment & Plan Note (Signed)
Stable off meds. Will remove from list.  Anticipate weight loss contributed.

## 2013-02-03 NOTE — Assessment & Plan Note (Signed)
Advanced.  Continue slow wean off psychotropics. Decrease klonopin to once nightly. Stop divalproate. Increase trazodone to 75mg  nightly to help with insomnia. Continue sertraline. Discussed one change at a time over next 3 months. I never received records from psychiatrist.

## 2013-02-03 NOTE — Assessment & Plan Note (Signed)
Will need alb checked at physical.

## 2013-02-03 NOTE — Progress Notes (Signed)
Subjective:    Patient ID: Krista [name], female    DOB: 1941-03-28, 72 y.o.   MRN: [id number]  HPI CC: NH f/u  Krista [name] presents today for f/u recent NH discharge after hospitalization for AMS with FTT in setting of C diff.  Treated with flagyl.  Discharged to [facility].  Home for last 2 weeks.  HHPT - doing well.  Twice a week.  Some walking.  Nurse coming out once a week as well.  Not sleeping well.  No napping during day.  Good light during day and dark at night.  Some sundowning - more restless at night.  Nutrition - rec pureed diet from nursing home.   Taking one ensure supplement at mid day. Eating well, appetite good.  Yogurt with scrambled eggs for breakfast.  Lunch - diverse.  Dinner - meat and vegetables and starch (pureed). Drinking water, tea, orange juice. Voiding well.  Uses depens regularly. Formed stools, once daily. Wt Readings from Last 3 Encounters:  02/03/13 103 lb 12 oz (47.061 kg)  12/23/12 109 lb (49.442 kg)  11/14/12 118 lb 4 oz (53.638 kg)   Body mass index is 17.8 kg/(m^2). Lab Results  Component Value Date   TSH 0.967 10/09/2012   Admit date: 12/23/2012  Discharge date: 12/26/2012   Recommendations for Outpatient Follow-up:  1. Patient presented with AMS manifested as decrease in activity level and po intake. Suspect that this is most likely due to advancing dementia but tested positive for C diff. If condition does not improve on oral antibiotics please discuss palliative care with family.  Discharge Diagnoses:  Principal Problem:  Adult failure to thrive  Active Problems:  Dementia  Protein calorie malnutrition  Mental status change  Hypokalemia  Protein-calorie malnutrition, severe  Enteritis due to Clostridium difficile  Discharge Condition: Stable  Diet recommendation: Full liquid diet with Ensure supplement and multivitamin  Filed Weights    12/23/12 2200   Weight:  49.442 kg (109 lb)   History of present illness:  Patient is a 72  y/o with h/o of advanced dementia who presented to the ED brought by daughter who reported decrease in activity level and oral intake.  Hospital Course:  1. Adult failure to thrive - consult speech therapist given history of poor oral intake and dementia. Currently they are recommending liquid diet  - consulted dietitian and patient currently on Ensure supplementation TID  - routine AMS lab work was negative  - CT of head reported as no acute intracranial abnormality, CT of abdomen and pelvis reported as no acute findings, urinalysis negative, Pt afebrile in the last 24 hours.  - C diff positive and may be contributing to 1. Flagyl added and will continue on discharge to complete a 10 day course.  2. Dementia  - most likely contributing or leading to # 1  - Should patient continue to decline I would recommend initiating palliative services for family. Would recommend that PCP at SNF discuss with family.  3. Hypokalemia  - replace orally if able to tolerate po intake.  - Will also replace IV prior to discharge.  4. Protein calorie malnutrition  - most likely due to poor oral intake most likely 2ary to worsening dementia.  - Continue ensure supplementation.  5. C diff +  - Added flagyl at this juncture  - May be contributing to poor oral intake and AMS.  -reassess next am.  Procedures:  none Consultations:  Speech therapy  Dietitian  Past Medical History  Diagnosis Date  . Hypertension   . Hypothyroidism   . HLD (hyperlipidemia)   . Osteoporosis   . Dementia   . Vitamin D deficiency   . RLS (restless legs syndrome)   . Allergic rhinitis   . Incontinence     Bowel and bladder  . Hx: UTI (urinary tract infection)   . History of asthma   . Depression   . GAD (generalized anxiety disorder)     Past Surgical History  Procedure Laterality Date  . Mri/mra  2010    WNL  . Sleep study  2010  . Dexa  11/2011    f/u 2 yrs?  . Dexa  06/2009    Tscore spine -0.58, hip -2.67     Family History  Problem Relation Age of Onset  . CAD Father     CABG  . Dementia Father 13  . Kidney disease Mother   . Cancer Paternal Aunt     breast and pancras   Review of Systems Per HPI    Objective:   Physical Exam  Nursing note and vitals reviewed. Constitutional: She appears well-developed and well-nourished. No distress.  HENT:  Mouth/Throat: Oropharynx is clear and moist. No oropharyngeal exudate.  Cardiovascular: Normal rate, regular rhythm, normal heart sounds and intact distal pulses.   No murmur heard. Pulmonary/Chest: Effort normal and breath sounds normal. No respiratory distress. She has no wheezes. She has no rales.  Musculoskeletal: She exhibits no edema.  L hand 4th/5th digits fixed in flexion, able to extend but seems painful       Assessment & Plan:

## 2013-02-03 NOTE — Addendum Note (Signed)
Addended by: Josph Macho A on: 02/03/2013 12:36 PM   Modules accepted: Orders

## 2013-02-03 NOTE — Assessment & Plan Note (Signed)
discharged from NH - noted continued weight loss despite what sounds like appropriate eating habits.  rec continue ensure regularly.  Recheck next visit.

## 2013-02-04 ENCOUNTER — Telehealth: Payer: Self-pay | Admitting: *Deleted

## 2013-02-04 NOTE — Telephone Encounter (Signed)
Consuella Lose with [facility] called requesting order for patient to order incontinence supplies from them. This will be at no cost to the patient if you are agreeable. She will need order on written script with Dx code faxed to [phone].

## 2013-02-04 NOTE — Telephone Encounter (Signed)
Order faxed as requested

## 2013-02-04 NOTE — Telephone Encounter (Signed)
Script written out and placed in Kim's box 

## 2013-02-09 ENCOUNTER — Other Ambulatory Visit: Payer: Self-pay | Admitting: *Deleted

## 2013-02-09 MED ORDER — CLONAZEPAM 1 MG PO TABS: 1.0000 mg | ORAL_TABLET | Freq: Every evening | ORAL | Status: DC | PRN

## 2013-02-09 NOTE — Telephone Encounter (Signed)
Rx called in as directed.   

## 2013-02-09 NOTE — Telephone Encounter (Signed)
Ok to refill 

## 2013-02-09 NOTE — Telephone Encounter (Signed)
plz phone in. 

## 2013-02-10 ENCOUNTER — Other Ambulatory Visit: Payer: Self-pay | Admitting: Family Medicine

## 2013-03-09 ENCOUNTER — Other Ambulatory Visit: Payer: Self-pay | Admitting: *Deleted

## 2013-03-09 NOTE — Telephone Encounter (Signed)
Ok to refill 

## 2013-03-10 MED ORDER — TRAZODONE HCL 50 MG PO TABS: 75.0000 mg | ORAL_TABLET | Freq: Every day | ORAL | Status: DC

## 2013-03-10 MED ORDER — CLONAZEPAM 1 MG PO TABS: 1.0000 mg | ORAL_TABLET | Freq: Every evening | ORAL | Status: DC | PRN

## 2013-03-10 NOTE — Telephone Encounter (Signed)
plz phone in klonopin. 

## 2013-03-10 NOTE — Telephone Encounter (Signed)
Rx called in as directed.   

## 2013-04-09 ENCOUNTER — Other Ambulatory Visit: Payer: Self-pay | Admitting: *Deleted

## 2013-04-09 NOTE — Telephone Encounter (Signed)
Ok to refill 

## 2013-04-10 MED ORDER — CLONAZEPAM 1 MG PO TABS: 1.0000 mg | ORAL_TABLET | Freq: Every evening | ORAL | Status: DC | PRN

## 2013-04-10 NOTE — Telephone Encounter (Signed)
Rx called into pharmacy-meld,cma 

## 2013-04-10 NOTE — Telephone Encounter (Signed)
plz phone in. 

## 2013-04-15 ENCOUNTER — Other Ambulatory Visit: Payer: Self-pay | Admitting: *Deleted

## 2013-04-15 MED ORDER — LEVOTHYROXINE SODIUM 25 MCG PO TABS: 25.0000 ug | ORAL_TABLET | Freq: Every day | ORAL | Status: DC

## 2013-05-05 ENCOUNTER — Other Ambulatory Visit: Payer: Self-pay | Admitting: *Deleted

## 2013-05-05 MED ORDER — DIVALPROEX SODIUM 125 MG PO CPSP: 125.0000 mg | ORAL_CAPSULE | Freq: Two times a day (BID) | ORAL | Status: DC

## 2013-05-05 NOTE — Telephone Encounter (Signed)
Sent in. Let's decrease to 1 capsule twice daily for 1 month to monitor effect on mood.

## 2013-05-05 NOTE — Telephone Encounter (Signed)
Ok to refill? Daughter states you wanted patient to start decreasing this med. How should she start tapering her off?

## 2013-05-06 NOTE — Telephone Encounter (Signed)
Patient's daughter notified.

## 2013-05-13 ENCOUNTER — Telehealth: Payer: Self-pay | Admitting: *Deleted

## 2013-05-13 DIAGNOSIS — R2689 Other abnormalities of gait and mobility: Secondary | ICD-10-CM

## 2013-05-13 DIAGNOSIS — R627 Adult failure to thrive: Secondary | ICD-10-CM

## 2013-05-13 DIAGNOSIS — R1310 Dysphagia, unspecified: Secondary | ICD-10-CM

## 2013-05-13 DIAGNOSIS — E43 Unspecified severe protein-calorie malnutrition: Secondary | ICD-10-CM

## 2013-05-13 DIAGNOSIS — F0390 Unspecified dementia without behavioral disturbance: Secondary | ICD-10-CM

## 2013-05-13 NOTE — Telephone Encounter (Signed)
Noted. Agree.  Referral placed.

## 2013-05-13 NOTE — Telephone Encounter (Signed)
Due to some recent decline, family thinks it's time for a hospice referral just to get them on board before things get really bad. Patient is getting to the point that she is not ambulatory at all. She is losing weight despite oral intake. Basically just worsening failure to thrive. Thanks Dr. Reece AgarG.

## 2013-05-14 ENCOUNTER — Other Ambulatory Visit: Payer: Self-pay | Admitting: *Deleted

## 2013-05-14 MED ORDER — CLONAZEPAM 1 MG PO TABS: 1.0000 mg | ORAL_TABLET | Freq: Every evening | ORAL | Status: DC | PRN

## 2013-05-14 NOTE — Telephone Encounter (Signed)
Referral faxed to [facility].

## 2013-05-14 NOTE — Telephone Encounter (Signed)
plz phoen in. 

## 2013-05-14 NOTE — Telephone Encounter (Signed)
Ok to refill 

## 2013-05-15 ENCOUNTER — Encounter (HOSPITAL_COMMUNITY): Payer: Self-pay | Admitting: Emergency Medicine

## 2013-05-15 ENCOUNTER — Emergency Department (HOSPITAL_COMMUNITY)
Admission: EM | Admit: 2013-05-15 | Discharge: 2013-05-15 | Disposition: A | Discharge status: Home / Self Care | Payer: Medicare Other | Attending: Emergency Medicine | Admitting: Emergency Medicine

## 2013-05-15 ENCOUNTER — Emergency Department (HOSPITAL_COMMUNITY): Payer: Medicare Other

## 2013-05-15 DIAGNOSIS — E559 Vitamin D deficiency, unspecified: Secondary | ICD-10-CM | POA: Insufficient documentation

## 2013-05-15 DIAGNOSIS — J45909 Unspecified asthma, uncomplicated: Secondary | ICD-10-CM | POA: Insufficient documentation

## 2013-05-15 DIAGNOSIS — F329 Major depressive disorder, single episode, unspecified: Secondary | ICD-10-CM | POA: Insufficient documentation

## 2013-05-15 DIAGNOSIS — M810 Age-related osteoporosis without current pathological fracture: Secondary | ICD-10-CM | POA: Insufficient documentation

## 2013-05-15 DIAGNOSIS — Z792 Long term (current) use of antibiotics: Secondary | ICD-10-CM | POA: Insufficient documentation

## 2013-05-15 DIAGNOSIS — I10 Essential (primary) hypertension: Secondary | ICD-10-CM | POA: Insufficient documentation

## 2013-05-15 DIAGNOSIS — Z7982 Long term (current) use of aspirin: Secondary | ICD-10-CM | POA: Insufficient documentation

## 2013-05-15 DIAGNOSIS — E039 Hypothyroidism, unspecified: Secondary | ICD-10-CM | POA: Insufficient documentation

## 2013-05-15 DIAGNOSIS — Z79899 Other long term (current) drug therapy: Secondary | ICD-10-CM | POA: Insufficient documentation

## 2013-05-15 DIAGNOSIS — G2581 Restless legs syndrome: Secondary | ICD-10-CM | POA: Insufficient documentation

## 2013-05-15 DIAGNOSIS — Z8673 Personal history of transient ischemic attack (TIA), and cerebral infarction without residual deficits: Secondary | ICD-10-CM | POA: Insufficient documentation

## 2013-05-15 DIAGNOSIS — F3289 Other specified depressive episodes: Secondary | ICD-10-CM | POA: Insufficient documentation

## 2013-05-15 DIAGNOSIS — N390 Urinary tract infection, site not specified: Secondary | ICD-10-CM | POA: Insufficient documentation

## 2013-05-15 DIAGNOSIS — F411 Generalized anxiety disorder: Secondary | ICD-10-CM | POA: Insufficient documentation

## 2013-05-15 DIAGNOSIS — E785 Hyperlipidemia, unspecified: Secondary | ICD-10-CM | POA: Insufficient documentation

## 2013-05-15 DIAGNOSIS — F0390 Unspecified dementia without behavioral disturbance: Secondary | ICD-10-CM | POA: Insufficient documentation

## 2013-05-15 LAB — URINE MICROSCOPIC-ADD ON

## 2013-05-15 LAB — URINALYSIS, ROUTINE W REFLEX MICROSCOPIC
Bilirubin Urine: NEGATIVE
GLUCOSE, UA: NEGATIVE mg/dL
Ketones, ur: NEGATIVE mg/dL
Nitrite: NEGATIVE
PH: 6 (ref 5.0–8.0)
Protein, ur: NEGATIVE mg/dL
SPECIFIC GRAVITY, URINE: 1.024 (ref 1.005–1.030)
Urobilinogen, UA: 0.2 mg/dL (ref 0.0–1.0)

## 2013-05-15 MED ORDER — CEPHALEXIN 500 MG PO CAPS: 500.0000 mg | ORAL_CAPSULE | Freq: Four times a day (QID) | ORAL | Status: DC

## 2013-05-15 MED ORDER — ACETAMINOPHEN 325 MG PO TABS
650.0000 mg | ORAL_TABLET | Freq: Four times a day (QID) | ORAL | Status: DC | PRN
  Administered 2013-05-15: 650 mg via ORAL
  Filled 2013-05-15: qty 2

## 2013-05-15 MED ORDER — LIDOCAINE HCL 1 % IJ SOLN
INTRAMUSCULAR | Status: AC
  Administered 2013-05-15: 20 mL
  Filled 2013-05-15: qty 20

## 2013-05-15 MED ORDER — CEFTRIAXONE SODIUM 1 G IJ SOLR
1.0000 g | Freq: Once | INTRAMUSCULAR | Status: AC
  Administered 2013-05-15: 1 g via INTRAMUSCULAR
  Filled 2013-05-15: qty 10

## 2013-05-15 NOTE — Telephone Encounter (Signed)
Rx called in as directed.   

## 2013-05-15 NOTE — ED Provider Notes (Signed)
Medical screening examination/treatment/procedure(s) were performed by non-physician practitioner and as supervising physician I was immediately available for consultation/collaboration.  EKG Interpretation   None         Jaileigh Weimer, MD 05/15/13 0734 

## 2013-05-15 NOTE — Discharge Instructions (Signed)
Give your mother the keflex as prescribed.  Treat her fever w/ tylenol.  Follow up with her primary care doctor.  Please return to the ER if she has uncontrolled vomiting or change in behavior.

## 2013-05-15 NOTE — ED Provider Notes (Signed)
CSN: [id number]     Arrival date & time 05/15/13  0242 History   First MD Initiated Contact with Patient 05/15/13 [phone]     Chief Complaint  Patient presents with  . Hematuria   (Consider location/radiation/quality/duration/timing/severity/associated sxs/prior Treatment) HPI History provided by patient's daughter.  Pt has severe dementia and is both non-verbal and non-ambulatory.  Lives with her daughter.  While changing her diaper early this morning, she noted gross hematuria.  Because she has a h/o UTI, she brought her for evaluation.  Only associated symptom is single episode of diarrhea, grimacing and "tensing up" as if she is uncomfortable.  No known fever and has not had vomiting.  She has had mild cough as well as nasal congestion in the last 24 hours.  She has not given her anything for her symptoms.  Past Medical History  Diagnosis Date  . Hypertension   . Hypothyroidism   . HLD (hyperlipidemia)   . Osteoporosis   . Dementia   . Vitamin D deficiency   . RLS (restless legs syndrome)   . Allergic rhinitis   . Incontinence     Bowel and bladder  . Hx: UTI (urinary tract infection)   . History of asthma   . Depression   . GAD (generalized anxiety disorder)   . History of CVA (cerebrovascular accident)     old by CT scan 2014   Past Surgical History  Procedure Laterality Date  . Mri/mra  2010    WNL  . Sleep study  2010  . Dexa  11/2011    f/u 2 yrs?  . Dexa  06/2009    Tscore spine -0.58, hip -2.67   Family History  Problem Relation Age of Onset  . CAD Father 48    CABG  . Dementia Father 50  . Kidney disease Mother   . Cancer Paternal Aunt     breast and pancras   History  Substance Use Topics  . Smoking status: Passive Smoke Exposure - Never Smoker  . Smokeless tobacco: Never Used     Comment: Daily exposure  . Alcohol Use: No   OB History   Grav Para Term Preterm Abortions TAB SAB Ect Mult Living                 Review of Systems  All other systems  reviewed and are negative.    Allergies  Exelon and Sulfa antibiotics  Home Medications   Current Outpatient Rx  Name  Route  Sig  Dispense  Refill  . aspirin EC 81 MG tablet   Oral   Take 81 mg by mouth daily.         Marland Kitchen atorvastatin (LIPITOR) 80 MG tablet   Oral   Take 1 tablet (80 mg total) by mouth daily.   90 tablet   1   . Calcium Carbonate-Vitamin D (CALCIUM 600 + D PO)   Oral   Take 1 tablet by mouth daily.         . clonazePAM (KLONOPIN) 1 MG tablet   Oral   Take 1 tablet (1 mg total) by mouth at bedtime as needed for anxiety.   30 tablet   0   . divalproex (DEPAKOTE SPRINKLE) 125 MG capsule   Oral   Take 1 capsule (125 mg total) by mouth 2 (two) times daily.   60 capsule   1   . feeding supplement (ENSURE COMPLETE) LIQD   Oral   Take 237 mLs by  mouth daily.         Marland Kitchen. levothyroxine (SYNTHROID, LEVOTHROID) 25 MCG tablet   Oral   Take 1 tablet (25 mcg total) by mouth daily before breakfast.   90 tablet   0   . pramipexole (MIRAPEX) 0.25 MG tablet   Oral   Take 1 tablet (0.25 mg total) by mouth at bedtime.   90 tablet   3   . sertraline (ZOLOFT) 50 MG tablet   Oral   Take 50 mg by mouth daily.         . traZODone (DESYREL) 50 MG tablet   Oral   Take 1.5 tablets (75 mg total) by mouth at bedtime.   45 tablet   6   . cephALEXin (KEFLEX) 500 MG capsule   Oral   Take 1 capsule (500 mg total) by mouth 4 (four) times daily.   28 capsule   0   . triamcinolone cream (KENALOG) 0.1 %   Topical   Apply 1 application topically 2 (two) times daily as needed (for diaper rash).          BP 108/59  Pulse 82  Temp(Src) 100.2 F (37.9 C) (Rectal)  Resp 16  SpO2 93% Physical Exam  Nursing note and vitals reviewed. Constitutional: She appears well-developed and well-nourished.  Cachetic and appears older than stated age.  Sleeping.    HENT:  Head: Normocephalic and atraumatic.  Eyes:  Normal appearance  Neck: Normal range of motion.   Cardiovascular: Normal rate and regular rhythm.   Pulmonary/Chest: Effort normal and breath sounds normal. No respiratory distress.  Abdominal: Soft. Bowel sounds are normal. She exhibits no distension.  Pt does not wake with deep palpation of her abdomen.   Musculoskeletal: Normal range of motion.  Skin: Skin is warm and dry. No rash noted.  Psychiatric: Her behavior is normal.    ED Course  Procedures (including critical care time) Labs Review Labs Reviewed  URINALYSIS, ROUTINE W REFLEX MICROSCOPIC - Abnormal; Notable for the following:    APPearance TURBID (*)    Hgb urine dipstick SMALL (*)    Leukocytes, UA MODERATE (*)    All other components within normal limits  URINE MICROSCOPIC-ADD ON - Abnormal; Notable for the following:    Bacteria, UA MANY (*)    All other components within normal limits  URINE CULTURE   Imaging Review Dg Chest 2 View  05/15/2013   CLINICAL DATA:  Shortness of breath and chest pain  EXAM: CHEST  2 VIEW  COMPARISON:  10/09/2012  FINDINGS: Normal heart size. Aortic tortuosity. No edema, effusion, infiltrate, or pneumothorax. Remote sternal fracture, reference chest x-ray 07/13/2008.  IMPRESSION: No active cardiopulmonary disease.   Electronically Signed   By: Tiburcio PeaJonathan  Watts M.D.   On: 05/15/2013 05:53    EKG Interpretation   None       MDM   1. UTI (lower urinary tract infection)    73yo  demented, non-verbal, non-mobile F brought to ED by her daughter for gross hematuria that started this morning.  Febrile and abd benign on exam.  U/A positive for infection.  CXR ordered because patient has had a cough and congestion as well as possible dyspnea over past 24 hours.  Negative for infiltrate.  All results discussed w/ patients daughter.  Pt received IM rocephin as well as tylenol in ED and d/c'd home w/ keflex.  I recommended f/u w/ her PCP and Return precautions discussed.    VS improved at time  of discharge.     Otilio Miu,  PA-C 05/15/13 [phone]

## 2013-05-15 NOTE — ED Notes (Signed)
Per pt's daughter, pt noted to have hematuria last night, stated pt hx Alzheimer's but has been more lethargic and seems to be in more pain as she is tense.  Pt noted to have temp of 100.4 on admission to ED

## 2013-05-18 ENCOUNTER — Telehealth: Payer: Self-pay | Admitting: *Deleted

## 2013-05-18 LAB — URINE CULTURE: Colony Count: >=100000

## 2013-05-18 NOTE — Telephone Encounter (Signed)
I'd suggest evaluation in office in next few days - may wait until hospice eval is complete.

## 2013-05-18 NOTE — Telephone Encounter (Signed)
Patient has developed a persistent "wet" cough. She had a slight cough when she went to the ER but it has worsened. Unable to tell if she has a fever because she will not hold the thermometer in her mouth and unable to obtain an axillary temp but she feels warm per oldest daughter. She is currently on keflex for UTI. Hospice is coming out today for first eval. Do you want to wait and see what they say or do you think she may need to change abx? Her son in law was very sick over Christmas with a respiratory illness (?flu)-but her symptoms don't seem to be the flu-however its hard to tell since she's nonverbal.

## 2013-05-18 NOTE — Telephone Encounter (Signed)
Ok. I changed her wellness exam to this coming Wednesday. You had an opening. I figured that was easier than having 2 separate appts since she is nonambulatory now.

## 2013-05-19 ENCOUNTER — Encounter: Payer: Self-pay | Admitting: Radiology

## 2013-05-19 NOTE — Progress Notes (Signed)
ED Antimicrobial Stewardship Positive Culture Follow Up   Krista [name] is an 73 y.o. female who [facility] with a chief complaint of  Chief Complaint  Patient presents with  . Hematuria    Recent Results (from the past 720 hour(s))  URINE CULTURE     Status: None   Collection Time    05/15/13  3:44 AM      Result Value Range Status   Specimen Description URINE, CLEAN CATCH   Final   Special Requests NONE   Final   Culture  Setup Time     Final   Value: 05/15/2013 08:28     Performed at [facility]   Colony Count     Final   Value: >=100,000 COLONIES/ML     Performed at [facility]   Culture     Final   Value: ENTEROCOCCUS SPECIES     Performed at [facility]   Report Status 05/18/2013 FINAL   Final   Organism ID, Bacteria ENTEROCOCCUS SPECIES   Final    [x]  Treated with Cephalexin, organism resistant to prescribed antimicrobial []  Patient discharged originally without antimicrobial agent and treatment is now indicated  New antibiotic prescription: Amoxicillin 500mg  PO BID x 7 days  ED Provider: Ebbie Ridgehris Lawyer, PA-C   Cleon DewDulaney, [place] Robert 05/19/2013, 9:44 AM Infectious Diseases Pharmacist Phone# [phone]

## 2013-05-19 NOTE — ED Notes (Signed)
Post ED Visit - Positive Culture Follow-up: Successful Patient Follow-Up  Culture assessed and recommendations reviewed by: []  Wes Dulaney, Pharm.D., BCPS [x]  Celedonio MiyamotoJeremy Frens, Pharm.D., BCPS []  Georgina PillionElizabeth Martin, Pharm.D., BCPS []  BoneauMinh Pham, [address] BoulevardPharm.D., BCPS, AAHIVP []  Estella HuskMichelle Turner, Pharm.D., BCPS, AAHIVP  Positive urine culture  []  Patient discharged without antimicrobial prescription and treatment is now indicated [x]  Organism is resistant to prescribed ED discharge antimicrobial []  Patient with positive blood cultures  Changes discussed with ED provider: Stop Kelfex New antibiotic prescription Start Amoxicillin 500 mg po BID x 7 days [facility] [phone] Contacted patient: Daughter Caretaker date 05/19/2013 time 1423  Larena Soxunnally, [place] Shew Marie 05/19/2013, 2:21 PM

## 2013-05-20 ENCOUNTER — Ambulatory Visit (INDEPENDENT_AMBULATORY_CARE_PROVIDER_SITE_OTHER): Payer: Medicare Other | Admitting: Family Medicine

## 2013-05-20 ENCOUNTER — Encounter: Payer: Self-pay | Admitting: Family Medicine

## 2013-05-20 VITALS — BP 128/78 | HR 62 | Temp 97.6°F | Wt 92.8 lb

## 2013-05-20 DIAGNOSIS — R269 Unspecified abnormalities of gait and mobility: Secondary | ICD-10-CM

## 2013-05-20 DIAGNOSIS — Z0000 Encounter for general adult medical examination without abnormal findings: Secondary | ICD-10-CM | POA: Insufficient documentation

## 2013-05-20 DIAGNOSIS — F0390 Unspecified dementia without behavioral disturbance: Secondary | ICD-10-CM

## 2013-05-20 DIAGNOSIS — Z515 Encounter for palliative care: Secondary | ICD-10-CM | POA: Insufficient documentation

## 2013-05-20 DIAGNOSIS — N390 Urinary tract infection, site not specified: Secondary | ICD-10-CM

## 2013-05-20 DIAGNOSIS — R05 Cough: Secondary | ICD-10-CM | POA: Insufficient documentation

## 2013-05-20 DIAGNOSIS — M810 Age-related osteoporosis without current pathological fracture: Secondary | ICD-10-CM

## 2013-05-20 DIAGNOSIS — R2689 Other abnormalities of gait and mobility: Secondary | ICD-10-CM

## 2013-05-20 DIAGNOSIS — Z8673 Personal history of transient ischemic attack (TIA), and cerebral infarction without residual deficits: Secondary | ICD-10-CM

## 2013-05-20 DIAGNOSIS — R059 Cough, unspecified: Secondary | ICD-10-CM | POA: Insufficient documentation

## 2013-05-20 DIAGNOSIS — E43 Unspecified severe protein-calorie malnutrition: Secondary | ICD-10-CM

## 2013-05-20 NOTE — Assessment & Plan Note (Signed)
Hospice involved as of this week.  Family appreciative.

## 2013-05-20 NOTE — Assessment & Plan Note (Signed)
Thought due to severe dementia.  Doubt MSA or other parkinsonism.

## 2013-05-20 NOTE — Assessment & Plan Note (Signed)
Continue cal/vit D for now - consider discontinuation in future.

## 2013-05-20 NOTE — Progress Notes (Signed)
Subjective:    Patient ID: Krista [name], female    DOB: [birth year], 73 y.o.   MRN: [id number]  HPI CC: medicare wellness visit  Unable to complete hearing or vision screens 2/2 dementia. Unable to complete PHQ2 2/2 dementia. ++ high falls risk - rolls out of chair.  No injury  Recently evaluated at ER with dx UTI - started on keflex but this has been recently changed to amoxicillin after culture/sensitivities returned. Recent cough/congestion over last 1 week.  Wet sounding cough.  No fevers/chills.  No concerns for aspiration.  Present more when active.  No sneezing or runny nose.  Possible dyspnea 2/2 head congestion.  Voiding well. Stooling well. Eating well, good appetite.  Weight loss noted despite this. Wt Readings from Last 3 Encounters:  05/20/13 92 lb 12 oz (42.071 kg)  02/03/13 103 lb 12 oz (47.061 kg)  12/23/12 109 lb (49.442 kg)    Preventative:  Not candidate for mammogram, colon cancer screening or pelvic exam 2/2 dementia. Breast exam - clinical exam done today. Flu - 01/2013 Pneumovax - 2011 Tetanus - defer zostavax - Thinks has had shingles in past.  May be interested in shot - daughter will check with insurance. Advanced directives: hospice on board.  Daughters interested in DNR/DNI.  Will fill out MOST form today - limited interventions, temporary IV fluids, ok for abx, no feeding tubes.  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 Daughters interested in DNR/DNI.  MOST filled out - limited interventions, temporary IV fluids, ok for abx, no feeding tubes (05/2013).  Medications and allergies reviewed and updated in chart.  Past histories reviewed and updated if relevant as below. Patient Active Problem List   Diagnosis Date Noted  . History of CVA (cerebrovascular  accident)   . Protein-calorie malnutrition, severe 12/25/2012  . Adult failure to thrive 12/23/2012  . Mental status change 12/04/2012  . 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 Insomnia 10/18/2012  . Dysphagia 10/18/2012  . GAD (generalized anxiety disorder)   . Depression   . Incontinence   . RLS (restless legs syndrome)   . Vitamin D deficiency   . Osteoporosis   . Dementia 10/09/2012  . Hypothyroidism 10/09/2012  . Chronic kidney disease (CKD), stage II (mild) 10/09/2012   Past Medical History  Diagnosis Date  . Hypertension   . Hypothyroidism   . HLD (hyperlipidemia)   . Osteoporosis   . Dementia   . Vitamin D deficiency   . RLS (restless legs syndrome)   . Allergic rhinitis   . Incontinence     Bowel and bladder  . Hx: UTI (urinary tract infection)   . History of asthma   . Depression   . GAD (generalized anxiety disorder)   . History of CVA (cerebrovascular accident)     old by CT scan 2014   Past Surgical History  Procedure Laterality Date  . Mri/mra  2010    WNL  . Sleep study  2010  . Dexa  11/2011    f/u 2 yrs?  . Dexa  06/2009    Tscore spine -0.58, hip -2.67   History  Substance Use Topics  . Smoking status: Passive Smoke Exposure - Never Smoker  . Smokeless tobacco: Never Used     Comment: Daily exposure  .  Alcohol Use: No   Family History  Problem Relation Age of Onset  . CAD Father 3368    CABG  . Dementia Father 8069  . Kidney disease Mother   . Cancer Paternal Aunt     breast and pancras   Allergies  Allergen Reactions  . Exelon [Rivastigmine Tartrate] Other (See Comments)    Patch caused skin rash  . Sulfa Antibiotics Other (See Comments)    Unknown reaction per pt's daughters   Current Outpatient Prescriptions on File Prior to Visit  Medication Sig Dispense Refill  . aspirin EC 81 MG tablet Take 81 mg by mouth daily.      Marland Kitchen. atorvastatin (LIPITOR) 80 MG tablet Take 1 tablet (80 mg total) by mouth daily.  90 tablet  1  .  Calcium Carbonate-Vitamin D (CALCIUM 600 + D PO) Take 1 tablet by mouth daily.      . clonazePAM (KLONOPIN) 1 MG tablet Take 1 tablet (1 mg total) by mouth at bedtime as needed for anxiety.  30 tablet  0  . divalproex (DEPAKOTE SPRINKLE) 125 MG capsule Take 1 capsule (125 mg total) by mouth 2 (two) times daily.  60 capsule  1  . feeding supplement (ENSURE COMPLETE) LIQD Take 237 mLs by mouth daily.      Marland Kitchen. levothyroxine (SYNTHROID, LEVOTHROID) 25 MCG tablet Take 1 tablet (25 mcg total) by mouth daily before breakfast.  90 tablet  0  . pramipexole (MIRAPEX) 0.25 MG tablet Take 1 tablet (0.25 mg total) by mouth at bedtime.  90 tablet  3  . sertraline (ZOLOFT) 50 MG tablet Take 50 mg by mouth daily.      . traZODone (DESYREL) 50 MG tablet Take 1.5 tablets (75 mg total) by mouth at bedtime.  45 tablet  6  . triamcinolone cream (KENALOG) 0.1 % Apply 1 application topically 2 (two) times daily as needed (for diaper rash).       No current facility-administered medications on file prior to visit.     Review of Systems Per HPI    Objective:   Physical Exam  Nursing note and vitals reviewed. Constitutional: She appears well-developed and well-nourished. No distress.  cachectic  HENT:  Head: Normocephalic and atraumatic.  Right Ear: External ear normal.  Left Ear: External ear normal.  Nose: Nose normal. No mucosal edema or rhinorrhea.  Mouth/Throat: Uvula is midline and mucous membranes are normal.  Cerumen in both canals Does not open mouth to evaluate oropharynx  Eyes: Conjunctivae and EOM are normal. Pupils are equal, round, and reactive to light. No scleral icterus.  Neck: Normal range of motion. Neck supple. No JVD present. No thyromegaly present.  Cardiovascular: Normal rate, regular rhythm, normal heart sounds and intact distal pulses.   No murmur heard. Pulses:      Radial pulses are 2+ on the right side, and 2+ on the left side.  Pulmonary/Chest: Effort normal and breath sounds  normal. No respiratory distress. She has no wheezes. She has no rales. Right breast exhibits no inverted nipple, no mass, no nipple discharge, no skin change and no tenderness. Left breast exhibits no inverted nipple, no mass, no nipple discharge, no skin change and no tenderness. Breasts are symmetrical.  unable to get full listen to lungs as pt not cooperative with deep breaths  Abdominal: Soft. Bowel sounds are normal. She exhibits no distension and no mass. There is no tenderness. There is no rebound and no guarding.  Musculoskeletal: Normal range of motion. She  exhibits no edema.  Flexion contractures of bilateral hands  Lymphadenopathy:       Head (right side): No submandibular, no tonsillar, no preauricular and no posterior auricular adenopathy present.       Head (left side): No submandibular, no tonsillar, no preauricular and no posterior auricular adenopathy present.    She has no cervical adenopathy.    She has no axillary adenopathy.       Right axillary: No lateral adenopathy present.       Left axillary: No lateral adenopathy present.      Right: No supraclavicular adenopathy present.       Left: No supraclavicular adenopathy present.  Neurological: She is alert.  Nonambulatory, wheelchair bound  Skin: Skin is warm and dry. No rash noted.  Psychiatric: She has a normal mood and affect. Her behavior is normal. Judgment and thought content normal.       Assessment & Plan:

## 2013-05-20 NOTE — Assessment & Plan Note (Addendum)
Anticipate viral etiology - continue to monitor, would anticipate improvement.  Already with keflex and now amoxicillin on board should help cover any bacterial respiratory source. Red flags to notify us discussed.

## 2013-05-20 NOTE — Progress Notes (Signed)
Pre-visit discussion using our clinic review tool. No additional management support is needed unless otherwise documented below in the visit note.  

## 2013-05-20 NOTE — Assessment & Plan Note (Signed)
S/p recent eval at ER - records and UCx reviewed.  abx changed to amoxicillin - pt to start taking today. presented as gross hematuria.

## 2013-05-20 NOTE — Assessment & Plan Note (Signed)
Evidence of malnutrition and cachexia.  Encouraged continued ensure bid which she takes.  Family endorse good appetite.  Consider remeron instead of zoloft next visit. Last Albumin 3.0

## 2013-05-20 NOTE — Assessment & Plan Note (Signed)
Reviewed pros/cons of continued lipitor - will continue for now, but at lower dose given weight loss.  Consider d/c completely. Continue aspirin.

## 2013-05-20 NOTE — Assessment & Plan Note (Signed)
I have personally reviewed the Medicare Annual Wellness questionnaire and have noted 1. The patient's medical and social history 2. Their use of alcohol, tobacco or illicit drugs 3. Their current medications and supplements 4. The patient's functional ability including ADL's, fall risks, home safety risks and hearing or visual impairment. 5. Diet and physical activity 6. Evidence for depression or mood disorders The patients weight, height, BMI have been recorded in the chart.  Hearing and vision has been addressed. I have made referrals, counseling and provided education to the patient based review of the above and I have provided the pt with a written personalized care plan for preventive services. See scanned questionairre. Advanced directives discussed: MOST form filled out .  Pt is DNR/DNI but ok for ER transfers.  Reviewed preventative protocols and updated unless pt declined. Breast exam ok today.  Will discontinue other cancer screens.

## 2013-05-20 NOTE — Patient Instructions (Addendum)
Let's cut lipitor in half.   When you're done with depakote, may stop. Call your insurance about the shingles shot to see if it is covered or how much it would cost and where is cheaper (here or pharmacy).  If you want to receive here, call for nurse visit. MOST form and DNR form filled out today. I'm glad hospice is involved. Continue ensure.

## 2013-05-20 NOTE — Assessment & Plan Note (Signed)
severe - non verbal, non ambulatory.  Hospice involved for end stage dementia.

## 2013-05-21 DIAGNOSIS — F0280 Dementia in other diseases classified elsewhere without behavioral disturbance: Secondary | ICD-10-CM

## 2013-05-21 DIAGNOSIS — R634 Abnormal weight loss: Secondary | ICD-10-CM

## 2013-05-21 DIAGNOSIS — R1310 Dysphagia, unspecified: Secondary | ICD-10-CM

## 2013-05-21 DIAGNOSIS — G309 Alzheimer's disease, unspecified: Secondary | ICD-10-CM

## 2013-05-25 ENCOUNTER — Encounter: Payer: Medicare Other | Admitting: Family Medicine

## 2013-05-25 ENCOUNTER — Telehealth: Payer: Self-pay | Admitting: *Deleted

## 2013-05-25 NOTE — Telephone Encounter (Signed)
Debbie with Hospice called and would like a verbal order for Thick-it to help thicken thin liquids for patient.

## 2013-05-25 NOTE — Telephone Encounter (Signed)
Debbie notified. 

## 2013-05-25 NOTE — Telephone Encounter (Signed)
Ok to do - plz give verbal order for this.

## 2013-06-03 ENCOUNTER — Telehealth: Payer: Self-pay | Admitting: Family Medicine

## 2013-06-03 MED ORDER — CLONAZEPAM 0.5 MG PO TABS: 0.5000 mg | ORAL_TABLET | Freq: Every evening | ORAL | Status: DC | PRN

## 2013-06-03 NOTE — Telephone Encounter (Signed)
Rx called in as directed and patient's daughter notified.

## 2013-06-03 NOTE — Telephone Encounter (Signed)
We don't want to take her off klonopin suddenly.  Given situation and unclear continued need for nightly benzo for patient I would suggest slow taper off this med over next several months as well. Monitor for increased agitation at night time and let me know if this happens. plz phone in lower dose of klonopin.

## 2013-06-03 NOTE — Addendum Note (Signed)
Addended by: Eustaquio BoydenGUTIERREZ, Kshawn Canal on: 06/03/2013 12:52 PM   Modules accepted: Orders

## 2013-06-03 NOTE — Telephone Encounter (Signed)
Received refill request for Klonopin 10 days too soon. Spoke with patient's daughter who was unsure exactly why meds ran out so soon (she thought it seemed too early as well) but after talking with her-it is suspected that patient's son may have stolen them. He has done this multiple times in the past. He has a major addiction issue with alcohol currently and hasn't had any in a few days due to not having any money to buy any. Assumed to be using the klonopin instead. Patient's daughter also noticed that some of her own hydrocodone is missing as well while on the phone with her. Advised to start locking meds up in lock-box. She advised she would and I advised I would talk to you.

## 2013-06-04 ENCOUNTER — Encounter: Payer: Self-pay | Admitting: Family Medicine

## 2013-06-04 NOTE — Telephone Encounter (Signed)
plz schedule 4 mo f/u

## 2013-06-04 NOTE — Telephone Encounter (Signed)
Appt scheduled

## 2013-06-09 ENCOUNTER — Encounter (HOSPITAL_COMMUNITY): Payer: Self-pay | Admitting: Emergency Medicine

## 2013-06-09 ENCOUNTER — Emergency Department (HOSPITAL_COMMUNITY)

## 2013-06-09 ENCOUNTER — Emergency Department (HOSPITAL_COMMUNITY)
Admission: EM | Admit: 2013-06-09 | Discharge: 2013-06-09 | Disposition: A | Discharge status: Home / Self Care | Attending: Emergency Medicine | Admitting: Emergency Medicine

## 2013-06-09 DIAGNOSIS — S1093XA Contusion of unspecified part of neck, initial encounter: Secondary | ICD-10-CM

## 2013-06-09 DIAGNOSIS — W07XXXA Fall from chair, initial encounter: Secondary | ICD-10-CM | POA: Insufficient documentation

## 2013-06-09 DIAGNOSIS — E039 Hypothyroidism, unspecified: Secondary | ICD-10-CM | POA: Insufficient documentation

## 2013-06-09 DIAGNOSIS — Z8673 Personal history of transient ischemic attack (TIA), and cerebral infarction without residual deficits: Secondary | ICD-10-CM | POA: Insufficient documentation

## 2013-06-09 DIAGNOSIS — S41109A Unspecified open wound of unspecified upper arm, initial encounter: Secondary | ICD-10-CM | POA: Insufficient documentation

## 2013-06-09 DIAGNOSIS — E559 Vitamin D deficiency, unspecified: Secondary | ICD-10-CM | POA: Insufficient documentation

## 2013-06-09 DIAGNOSIS — E785 Hyperlipidemia, unspecified: Secondary | ICD-10-CM | POA: Insufficient documentation

## 2013-06-09 DIAGNOSIS — S0100XA Unspecified open wound of scalp, initial encounter: Secondary | ICD-10-CM | POA: Insufficient documentation

## 2013-06-09 DIAGNOSIS — Z7982 Long term (current) use of aspirin: Secondary | ICD-10-CM | POA: Insufficient documentation

## 2013-06-09 DIAGNOSIS — S0101XA Laceration without foreign body of scalp, initial encounter: Secondary | ICD-10-CM

## 2013-06-09 DIAGNOSIS — J45909 Unspecified asthma, uncomplicated: Secondary | ICD-10-CM | POA: Insufficient documentation

## 2013-06-09 DIAGNOSIS — F0390 Unspecified dementia without behavioral disturbance: Secondary | ICD-10-CM | POA: Insufficient documentation

## 2013-06-09 DIAGNOSIS — G2581 Restless legs syndrome: Secondary | ICD-10-CM | POA: Insufficient documentation

## 2013-06-09 DIAGNOSIS — I10 Essential (primary) hypertension: Secondary | ICD-10-CM | POA: Insufficient documentation

## 2013-06-09 DIAGNOSIS — S0083XA Contusion of other part of head, initial encounter: Secondary | ICD-10-CM

## 2013-06-09 DIAGNOSIS — F3289 Other specified depressive episodes: Secondary | ICD-10-CM | POA: Insufficient documentation

## 2013-06-09 DIAGNOSIS — Z87440 Personal history of urinary (tract) infections: Secondary | ICD-10-CM | POA: Insufficient documentation

## 2013-06-09 DIAGNOSIS — F411 Generalized anxiety disorder: Secondary | ICD-10-CM | POA: Insufficient documentation

## 2013-06-09 DIAGNOSIS — F329 Major depressive disorder, single episode, unspecified: Secondary | ICD-10-CM | POA: Insufficient documentation

## 2013-06-09 DIAGNOSIS — Y92009 Unspecified place in unspecified non-institutional (private) residence as the place of occurrence of the external cause: Secondary | ICD-10-CM | POA: Insufficient documentation

## 2013-06-09 DIAGNOSIS — S0003XA Contusion of scalp, initial encounter: Secondary | ICD-10-CM | POA: Insufficient documentation

## 2013-06-09 DIAGNOSIS — Y9389 Activity, other specified: Secondary | ICD-10-CM | POA: Insufficient documentation

## 2013-06-09 DIAGNOSIS — Z79899 Other long term (current) drug therapy: Secondary | ICD-10-CM | POA: Insufficient documentation

## 2013-06-09 DIAGNOSIS — M810 Age-related osteoporosis without current pathological fracture: Secondary | ICD-10-CM | POA: Insufficient documentation

## 2013-06-09 NOTE — ED Provider Notes (Addendum)
CSN: [id number]     Arrival date & time 06/09/13  0012 History   First MD Initiated Contact with Patient 06/09/13 0028     Chief Complaint  Patient presents with  . Head Injury   (Consider location/radiation/quality/duration/timing/severity/associated sxs/prior Treatment) HPI Level 5 Caveat: dementia. This is a 73 year old female on hospice for end-stage dementia. She was sitting in a chair at home just prior to arrival and fell forward out of the chair striking her left temple against the fireplace. She has a laceration to her left temple and a superficial skin tear to the left arm. There was significant bleeding from the head wound but has been controlled with pressure. She did not lose consciousness. She has not been vomiting. She is at her baseline neurologically.  Past Medical History  Diagnosis Date  . Hypertension   . Hypothyroidism   . HLD (hyperlipidemia)   . Osteoporosis   . Dementia   . Vitamin D deficiency   . RLS (restless legs syndrome)   . Allergic rhinitis   . Incontinence     Bowel and bladder  . Hx: UTI (urinary tract infection)   . History of asthma   . Depression   . GAD (generalized anxiety disorder)   . History of CVA (cerebrovascular accident)     old by CT scan 2014  . Hospice care patient     DNR/DNI  . DNR (do not resuscitate)    Past Surgical History  Procedure Laterality Date  . Mri/mra  2010    WNL  . Sleep study  2010  . Dexa  11/2011    f/u 2 yrs?  . Dexa  06/2009    Tscore spine -0.58, hip -2.67   Family History  Problem Relation Age of Onset  . CAD Father 33    CABG  . Dementia Father 102  . Kidney disease Mother   . Cancer Paternal Aunt     breast and pancras   History  Substance Use Topics  . Smoking status: Passive Smoke Exposure - Never Smoker  . Smokeless tobacco: Never Used     Comment: Daily exposure  . Alcohol Use: No   OB History   Grav Para Term Preterm Abortions TAB SAB Ect Mult Living                 Review of  Systems  Unable to perform ROS   Allergies  Exelon and Sulfa antibiotics  Home Medications   Current Outpatient Rx  Name  Route  Sig  Dispense  Refill  . aspirin EC 81 MG tablet   Oral   Take 81 mg by mouth daily.         Marland Kitchen atorvastatin (LIPITOR) 80 MG tablet   Oral   Take 40 mg by mouth daily.         . Calcium Carbonate-Vitamin D (CALCIUM 600 + D PO)   Oral   Take 1 tablet by mouth daily.         . clonazePAM (KLONOPIN) 0.5 MG tablet   Oral   Take 1 tablet (0.5 mg total) by mouth at bedtime as needed for anxiety.   30 tablet   0   . divalproex (DEPAKOTE SPRINKLE) 125 MG capsule   Oral   Take 1 capsule (125 mg total) by mouth 2 (two) times daily.   60 capsule   1   . feeding supplement (ENSURE COMPLETE) LIQD   Oral   Take 237 mLs by mouth  2 (two) times daily between meals.          Marland Kitchen. levothyroxine (SYNTHROID, LEVOTHROID) 25 MCG tablet   Oral   Take 1 tablet (25 mcg total) by mouth daily before breakfast.   90 tablet   0   . pramipexole (MIRAPEX) 0.25 MG tablet   Oral   Take 1 tablet (0.25 mg total) by mouth at bedtime.   90 tablet   3   . sertraline (ZOLOFT) 50 MG tablet   Oral   Take 50 mg by mouth daily.         . traZODone (DESYREL) 50 MG tablet   Oral   Take 1.5 tablets (75 mg total) by mouth at bedtime.   45 tablet   6   . triamcinolone cream (KENALOG) 0.1 %   Topical   Apply 1 application topically 2 (two) times daily as needed (for diaper rash).          BP 155/80  Pulse 83  Temp(Src) 97.5 F (36.4 C) (Oral)  Resp 18  SpO2 96%  Physical Exam General: Well-developed, cachectic female in no acute distress; appearance consistent with age of record HENT: normocephalic; laceration left temple Eyes: pupils equal, round and reactive to light; cannot assess extraocular muscles Neck: supple Heart: regular rate and rhythm Lungs: clear to auscultation bilaterally Abdomen: soft; nondistended Extremities: Contractures of hands  left greater than right; no edema Neurologic: Patient nonverbal; cannot follow instructions; noted to move all extremities but exam is limited due to dementia; resting tremor of right upper extremity Skin: Warm and dry     ED Course  Procedures (including critical care time)  LACERATION REPAIR Performed by: Hanley SeamenMOLPUS,Lathon Adan L Authorized by: Hanley SeamenMOLPUS,Dashon Mcintire L Consent: Verbal consent obtained. Risks and benefits: risks, benefits and alternatives were discussed Consent given by: patient Patient identity confirmed: provided demographic data Prepped and Draped in normal sterile fashion Wound explored  Laceration Location: Left temple  Laceration Length: 2 cm  No Foreign Bodies seen or palpated  Anesthesia: None   Irrigation method: syringe Amount of cleaning: standard  Skin closure: Staples   Number of sutures: 3   Patient tolerance: Patient tolerated the procedure well with no immediate complications.   MDM  Nursing notes and vitals signs, including pulse oximetry, reviewed.  Summary of this visit's results, reviewed by myself:  Labs:  No results found for this or any previous visit (from the past 24 hour(s)).  Imaging Studies: Ct Head Wo Contrast  06/09/2013   CLINICAL DATA:  Status post fall with a blow to the head.  EXAM: CT HEAD WITHOUT CONTRAST  CT CERVICAL SPINE WITHOUT CONTRAST  TECHNIQUE: Multidetector CT imaging of the head and cervical spine was performed following the standard protocol without intravenous contrast. Multiplanar CT image reconstructions of the cervical spine were also generated.  COMPARISON:  Head CT scan 12/23/2012.  FINDINGS: CT HEAD FINDINGS  The brain is atrophic with chronic microvascular ischemic change. No evidence of acute abnormality including infarction, hemorrhage, mass lesion, mass effect, midline shift or abnormal extra-axial fluid collection is identified. There is no hydrocephalus or pneumocephalus. There appears to be a scalp hematoma over  the left parietal bone without underlying fracture or foreign body.  CT CERVICAL SPINE FINDINGS  There is no fracture or malalignment. Loss of disc space height and endplate spurring appear worst at C5-6 and C6-7. Lung apices are clear.  IMPRESSION: Scalp hematoma over the left parietal bone without underlying foreign body or fracture.  No acute intracranial  abnormality or abnormality of the cervical spine.  Atrophy and chronic microvascular ischemic change.  Cervical spondylosis.   Electronically Signed   By: Drusilla Kanner M.D.   On: 06/09/2013 01:12   Ct Cervical Spine Wo Contrast  06/09/2013   CLINICAL DATA:  Status post fall with a blow to the head.  EXAM: CT HEAD WITHOUT CONTRAST  CT CERVICAL SPINE WITHOUT CONTRAST  TECHNIQUE: Multidetector CT imaging of the head and cervical spine was performed following the standard protocol without intravenous contrast. Multiplanar CT image reconstructions of the cervical spine were also generated.  COMPARISON:  Head CT scan 12/23/2012.  FINDINGS: CT HEAD FINDINGS  The brain is atrophic with chronic microvascular ischemic change. No evidence of acute abnormality including infarction, hemorrhage, mass lesion, mass effect, midline shift or abnormal extra-axial fluid collection is identified. There is no hydrocephalus or pneumocephalus. There appears to be a scalp hematoma over the left parietal bone without underlying fracture or foreign body.  CT CERVICAL SPINE FINDINGS  There is no fracture or malalignment. Loss of disc space height and endplate spurring appear worst at C5-6 and C6-7. Lung apices are clear.  IMPRESSION: Scalp hematoma over the left parietal bone without underlying foreign body or fracture.  No acute intracranial abnormality or abnormality of the cervical spine.  Atrophy and chronic microvascular ischemic change.  Cervical spondylosis.   Electronically Signed   By: Drusilla Kanner M.D.   On: 06/09/2013 01:12   1:32 AM Patient's daughter advised to  return the patient should she become unarousable, have seizures or uncontrolled vomiting.      Hanley Seamen, MD 06/09/13 0130  Hanley Seamen, MD 06/09/13 5621

## 2013-06-09 NOTE — ED Notes (Signed)
Pt rolled out of chair at home hitting head on fire place. Pt has small bleeding puncture to L side of head. Skin tear noted to L arm. Pt takes ASA daily. Family reports no LOC or vomiting. Last tetanus unknown.

## 2013-06-09 NOTE — ED Notes (Signed)
Patient is alert and oriented x3.  She was given DC instructions and follow up visit instructions.  Patient gave verbal understanding. She was DC ambulatory under her own power to home.  V/S stable.  He was not showing any signs of distress on DC 

## 2013-06-16 ENCOUNTER — Encounter: Payer: Self-pay | Admitting: Radiology

## 2013-06-17 ENCOUNTER — Ambulatory Visit (INDEPENDENT_AMBULATORY_CARE_PROVIDER_SITE_OTHER): Payer: Medicare Other | Admitting: Family Medicine

## 2013-06-17 ENCOUNTER — Encounter: Payer: Self-pay | Admitting: Family Medicine

## 2013-06-17 VITALS — BP 144/70 | HR 88 | Temp 95.4°F | Wt 84.5 lb

## 2013-06-17 DIAGNOSIS — E43 Unspecified severe protein-calorie malnutrition: Secondary | ICD-10-CM

## 2013-06-17 DIAGNOSIS — Z4802 Encounter for removal of sutures: Secondary | ICD-10-CM | POA: Insufficient documentation

## 2013-06-17 DIAGNOSIS — E039 Hypothyroidism, unspecified: Secondary | ICD-10-CM

## 2013-06-17 DIAGNOSIS — T68XXXA Hypothermia, initial encounter: Secondary | ICD-10-CM

## 2013-06-17 DIAGNOSIS — F0390 Unspecified dementia without behavioral disturbance: Secondary | ICD-10-CM

## 2013-06-17 LAB — COMPREHENSIVE METABOLIC PANEL
ALBUMIN: 3.4 g/dL — AB (ref 3.5–5.2)
ALT: 37 U/L — AB (ref 0–35)
AST: 25 U/L (ref 0–37)
Alkaline Phosphatase: 66 U/L (ref 39–117)
BUN: 25 mg/dL — ABNORMAL HIGH (ref 6–23)
CALCIUM: 9 mg/dL (ref 8.4–10.5)
CHLORIDE: 99 meq/L (ref 96–112)
CO2: 30 mEq/L (ref 19–32)
CREATININE: 0.8 mg/dL (ref 0.4–1.2)
GFR: 79.15 mL/min (ref >60.00)
Glucose, Bld: 83 mg/dL (ref 70–99)
POTASSIUM: 4.1 meq/L (ref 3.5–5.1)
Sodium: 137 mEq/L (ref 135–145)
Total Bilirubin: 0.8 mg/dL (ref 0.3–1.2)
Total Protein: 6.3 g/dL (ref 6.0–8.3)

## 2013-06-17 LAB — T4, FREE: Free T4: 1.1 ng/dL (ref 0.60–1.60)

## 2013-06-17 LAB — TSH: TSH: 6.3 u[IU]/mL — AB (ref 0.35–5.50)

## 2013-06-17 NOTE — Assessment & Plan Note (Addendum)
Continued weight loss noted despite what sounds like good appetite with ensure cans BID. Check blood work including TSH and prealbumin/albumin. Hypothermia noted today - check B1 level as well as TSH.

## 2013-06-17 NOTE — Progress Notes (Signed)
BP 144/70  Pulse 88  Temp(Src) 95.4 F (35.2 C) (Tympanic)  Wt 84 lb 8 oz (38.329 kg)   CC: staple removal  Subjective:    Patient ID: Krista [name], female    DOB: October 11, 1940, 73 y.o.   MRN: [id number]  HPI: Krista [name] is a 73 y.o. female presenting on 06/17/2013 with Suture / Staple Removal  Recently seen at ER after she suffered a fall 06/09/2013 forward out of her chair onto floor. Records reviewed.  Staples placed left temporal scalp.  Skin tear L wrist.  Presents today for staple removal.   Tolerating wean off klonopin.  To finish depakote script this month then will discontinue med.  Ct Head Wo Contrast  06/09/2013 CLINICAL DATA: Status post fall with a blow to the head. EXAM: CT HEAD WITHOUT CONTRAST CT CERVICAL SPINE WITHOUT CONTRAST TECHNIQUE: Multidetector CT imaging of the head and cervical spine was performed following the standard protocol without intravenous contrast. Multiplanar CT image reconstructions of the cervical spine were also generated. COMPARISON: Head CT scan 12/23/2012. FINDINGS: CT HEAD FINDINGS The brain is atrophic with chronic microvascular ischemic change. No evidence of acute abnormality including infarction, hemorrhage, mass lesion, mass effect, midline shift or abnormal extra-axial fluid collection is identified. There is no hydrocephalus or pneumocephalus. There appears to be a scalp hematoma over the left parietal bone without underlying fracture or foreign body. CT CERVICAL SPINE FINDINGS There is no fracture or malalignment. Loss of disc space height and endplate spurring appear worst at C5-6 and C6-7. Lung apices are clear. IMPRESSION: Scalp hematoma over the left parietal bone without underlying foreign body or fracture. No acute intracranial abnormality or abnormality of the cervical spine. Atrophy and chronic microvascular ischemic change. Cervical spondylosis. Electronically Signed By: Drusilla Kanner M.D. On: 06/09/2013 01:12   Relevant past medical,  surgical, family and social history reviewed and updated. Allergies and medications reviewed and updated. Current Outpatient Prescriptions on File Prior to Visit  Medication Sig  . aspirin EC 81 MG tablet Take 81 mg by mouth daily.  Marland Kitchen atorvastatin (LIPITOR) 80 MG tablet Take 40 mg by mouth daily.  . Calcium Carbonate-Vitamin D (CALCIUM 600 + D PO) Take 1 tablet by mouth daily.  . clonazePAM (KLONOPIN) 0.5 MG tablet Take 1 tablet (0.5 mg total) by mouth at bedtime as needed for anxiety.  . divalproex (DEPAKOTE SPRINKLE) 125 MG capsule Take 1 capsule (125 mg total) by mouth 2 (two) times daily.  . feeding supplement (ENSURE COMPLETE) LIQD Take 237 mLs by mouth 2 (two) times daily between meals.   Marland Kitchen levothyroxine (SYNTHROID, LEVOTHROID) 25 MCG tablet Take 1 tablet (25 mcg total) by mouth daily before breakfast.  . pramipexole (MIRAPEX) 0.25 MG tablet Take 1 tablet (0.25 mg total) by mouth at bedtime.  . sertraline (ZOLOFT) 50 MG tablet Take 50 mg by mouth daily.  . traZODone (DESYREL) 50 MG tablet Take 1.5 tablets (75 mg total) by mouth at bedtime.  . triamcinolone cream (KENALOG) 0.1 % Apply 1 application topically 2 (two) times daily as needed (for diaper rash).   No current facility-administered medications on file prior to visit.    Review of Systems Per HPI unless specifically indicated above    Objective:    BP 144/70  Pulse 88  Temp(Src) 95.4 F (35.2 C) (Tympanic)  Wt 84 lb 8 oz (38.329 kg)  Physical Exam  Nursing note and vitals reviewed. Constitutional: She appears well-developed and well-nourished. No distress.  Psychiatric:  Nonverbal  Assessment & Plan:   Problem List Items Addressed This Visit   Dementia (Chronic)   Hypothyroidism     Check TSH today.    Relevant Orders      TSH      T4, Free   Protein-calorie malnutrition, severe - Primary     Continued weight loss noted despite what sounds like good appetite with ensure cans BID. Check blood work  including TSH and prealbumin/albumin. Hypothermia noted today - check B1 level as well as TSH.    Relevant Orders      Comprehensive metabolic panel      Prealbumin   Removal of staple     3 staples removed.  Discussed wound care instructions.     Other Visit Diagnoses   Hypothermia        Relevant Orders       Vitamin B1        Follow up plan: Return as needed.

## 2013-06-17 NOTE — Progress Notes (Signed)
Pre-visit discussion using our clinic review tool. No additional management support is needed unless otherwise documented below in the visit note.  

## 2013-06-17 NOTE — Assessment & Plan Note (Signed)
3 staples removed.  Discussed wound care instructions.

## 2013-06-17 NOTE — Assessment & Plan Note (Signed)
Check TSH today

## 2013-06-17 NOTE — Patient Instructions (Signed)
Blood work today. Staples removed today.  In 2 days may start gently washing area with soapy water.

## 2013-06-18 LAB — PREALBUMIN: Prealbumin: 21.6 mg/dL (ref 17.0–34.0)

## 2013-06-20 LAB — VITAMIN B1

## 2013-06-22 ENCOUNTER — Other Ambulatory Visit: Payer: Self-pay | Admitting: Family Medicine

## 2013-06-22 MED ORDER — B COMPLEX VITAMINS PO CAPS: 1.0000 | ORAL_CAPSULE | Freq: Every day | ORAL | Status: DC

## 2013-06-26 ENCOUNTER — Telehealth: Payer: Self-pay | Admitting: *Deleted

## 2013-06-26 NOTE — Telephone Encounter (Signed)
Error; telephone note not needed.

## 2013-06-28 ENCOUNTER — Encounter: Payer: Self-pay | Admitting: Family Medicine

## 2013-06-29 MED ORDER — SERTRALINE HCL 50 MG PO TABS: 50.0000 mg | ORAL_TABLET | Freq: Every day | ORAL | Status: DC

## 2013-07-06 ENCOUNTER — Other Ambulatory Visit: Payer: Self-pay | Admitting: Family Medicine

## 2013-07-06 MED ORDER — CLONAZEPAM 0.5 MG PO TABS: 0.5000 mg | ORAL_TABLET | Freq: Every evening | ORAL | Status: DC | PRN

## 2013-07-06 NOTE — Telephone Encounter (Signed)
Ok to refill in Dr. G's absence? 

## 2013-07-06 NOTE — Telephone Encounter (Signed)
Please call in

## 2013-07-07 NOTE — Telephone Encounter (Signed)
Rx called to pharmacy as instructed. 

## 2013-07-22 ENCOUNTER — Telehealth: Payer: Self-pay | Admitting: *Deleted

## 2013-07-22 NOTE — Telephone Encounter (Signed)
FYI-[facility] will be [facility] today through 07/27/13 for Hospice respite while her primary caregiver (daughter) has surgery today.

## 2013-07-22 NOTE — Telephone Encounter (Signed)
Noted. Thanks.

## 2013-07-26 ENCOUNTER — Inpatient Hospital Stay (HOSPITAL_COMMUNITY)
Admission: EM | Admit: 2013-07-26 | Discharge: 2013-08-02 | DRG: 871 | Disposition: A | Discharge status: Hospice | Attending: Internal Medicine | Admitting: Internal Medicine

## 2013-07-26 ENCOUNTER — Encounter (HOSPITAL_COMMUNITY): Payer: Self-pay | Admitting: Emergency Medicine

## 2013-07-26 ENCOUNTER — Emergency Department (HOSPITAL_COMMUNITY)

## 2013-07-26 ENCOUNTER — Inpatient Hospital Stay (HOSPITAL_COMMUNITY)

## 2013-07-26 DIAGNOSIS — Z515 Encounter for palliative care: Secondary | ICD-10-CM

## 2013-07-26 DIAGNOSIS — I959 Hypotension, unspecified: Secondary | ICD-10-CM | POA: Diagnosis present

## 2013-07-26 DIAGNOSIS — N390 Urinary tract infection, site not specified: Secondary | ICD-10-CM | POA: Diagnosis present

## 2013-07-26 DIAGNOSIS — G2581 Restless legs syndrome: Secondary | ICD-10-CM | POA: Diagnosis present

## 2013-07-26 DIAGNOSIS — Z7401 Bed confinement status: Secondary | ICD-10-CM

## 2013-07-26 DIAGNOSIS — Z8673 Personal history of transient ischemic attack (TIA), and cerebral infarction without residual deficits: Secondary | ICD-10-CM

## 2013-07-26 DIAGNOSIS — A419 Sepsis, unspecified organism: Principal | ICD-10-CM | POA: Diagnosis present

## 2013-07-26 DIAGNOSIS — K7200 Acute and subacute hepatic failure without coma: Secondary | ICD-10-CM | POA: Diagnosis present

## 2013-07-26 DIAGNOSIS — J45909 Unspecified asthma, uncomplicated: Secondary | ICD-10-CM | POA: Diagnosis present

## 2013-07-26 DIAGNOSIS — E43 Unspecified severe protein-calorie malnutrition: Secondary | ICD-10-CM | POA: Diagnosis present

## 2013-07-26 DIAGNOSIS — E039 Hypothyroidism, unspecified: Secondary | ICD-10-CM | POA: Diagnosis present

## 2013-07-26 DIAGNOSIS — Z681 Body mass index (BMI) 19 or less, adult: Secondary | ICD-10-CM

## 2013-07-26 DIAGNOSIS — D649 Anemia, unspecified: Secondary | ICD-10-CM

## 2013-07-26 DIAGNOSIS — E871 Hypo-osmolality and hyponatremia: Secondary | ICD-10-CM

## 2013-07-26 DIAGNOSIS — R6521 Severe sepsis with septic shock: Secondary | ICD-10-CM

## 2013-07-26 DIAGNOSIS — Z79899 Other long term (current) drug therapy: Secondary | ICD-10-CM

## 2013-07-26 DIAGNOSIS — R627 Adult failure to thrive: Secondary | ICD-10-CM

## 2013-07-26 DIAGNOSIS — E785 Hyperlipidemia, unspecified: Secondary | ICD-10-CM | POA: Diagnosis present

## 2013-07-26 DIAGNOSIS — F411 Generalized anxiety disorder: Secondary | ICD-10-CM | POA: Diagnosis present

## 2013-07-26 DIAGNOSIS — N179 Acute kidney failure, unspecified: Secondary | ICD-10-CM | POA: Diagnosis present

## 2013-07-26 DIAGNOSIS — Z66 Do not resuscitate: Secondary | ICD-10-CM | POA: Diagnosis present

## 2013-07-26 DIAGNOSIS — M810 Age-related osteoporosis without current pathological fracture: Secondary | ICD-10-CM | POA: Diagnosis present

## 2013-07-26 DIAGNOSIS — F3289 Other specified depressive episodes: Secondary | ICD-10-CM | POA: Diagnosis present

## 2013-07-26 DIAGNOSIS — R579 Shock, unspecified: Secondary | ICD-10-CM

## 2013-07-26 DIAGNOSIS — N182 Chronic kidney disease, stage 2 (mild): Secondary | ICD-10-CM

## 2013-07-26 DIAGNOSIS — F0390 Unspecified dementia without behavioral disturbance: Secondary | ICD-10-CM | POA: Diagnosis present

## 2013-07-26 DIAGNOSIS — F329 Major depressive disorder, single episode, unspecified: Secondary | ICD-10-CM | POA: Diagnosis present

## 2013-07-26 DIAGNOSIS — R569 Unspecified convulsions: Secondary | ICD-10-CM | POA: Diagnosis present

## 2013-07-26 DIAGNOSIS — N189 Chronic kidney disease, unspecified: Secondary | ICD-10-CM | POA: Diagnosis present

## 2013-07-26 DIAGNOSIS — R6520 Severe sepsis without septic shock: Secondary | ICD-10-CM

## 2013-07-26 LAB — URINALYSIS, ROUTINE W REFLEX MICROSCOPIC
Bilirubin Urine: NEGATIVE
GLUCOSE, UA: NEGATIVE mg/dL
Ketones, ur: 15 mg/dL — AB
Leukocytes, UA: NEGATIVE
Nitrite: NEGATIVE
PH: 5 (ref 5.0–8.0)
Protein, ur: 100 mg/dL — AB
Specific Gravity, Urine: 1.029 (ref 1.005–1.030)
Urobilinogen, UA: 0.2 mg/dL (ref 0.0–1.0)

## 2013-07-26 LAB — DIFFERENTIAL
BASOS PCT: 0 % (ref 0–1)
Basophils Absolute: 0 10*3/uL (ref 0.0–0.1)
Eosinophils Absolute: 0 10*3/uL (ref 0.0–0.7)
Eosinophils Relative: 0 % (ref 0–5)
LYMPHS PCT: 15 % (ref 12–46)
Lymphs Abs: 0.7 10*3/uL (ref 0.7–4.0)
MONO ABS: 0.2 10*3/uL (ref 0.1–1.0)
Monocytes Relative: 5 % (ref 3–12)
NEUTROS ABS: 3.7 10*3/uL (ref 1.7–7.7)
Neutrophils Relative %: 80 % — ABNORMAL HIGH (ref 43–77)

## 2013-07-26 LAB — URINE MICROSCOPIC-ADD ON

## 2013-07-26 LAB — COMPREHENSIVE METABOLIC PANEL WITH GFR
ALT: 111 U/L — ABNORMAL HIGH (ref 0–35)
AST: 127 U/L — ABNORMAL HIGH (ref 0–37)
Albumin: 2.1 g/dL — ABNORMAL LOW (ref 3.5–5.2)
Alkaline Phosphatase: 54 U/L (ref 39–117)
BUN: 22 mg/dL (ref 6–23)
CO2: 19 meq/L (ref 19–32)
Calcium: 7.4 mg/dL — ABNORMAL LOW (ref 8.4–10.5)
Chloride: 107 meq/L (ref 96–112)
Creatinine, Ser: 1.21 mg/dL — ABNORMAL HIGH (ref 0.50–1.10)
GFR calc Af Amer: 50 mL/min — ABNORMAL LOW
GFR calc non Af Amer: 43 mL/min — ABNORMAL LOW
Glucose, Bld: 166 mg/dL — ABNORMAL HIGH (ref 70–99)
Potassium: 4.1 meq/L (ref 3.7–5.3)
Sodium: 141 meq/L (ref 137–147)
Total Bilirubin: 0.2 mg/dL — ABNORMAL LOW (ref 0.3–1.2)
Total Protein: 4.2 g/dL — ABNORMAL LOW (ref 6.0–8.3)

## 2013-07-26 LAB — CBC
HCT: 26.7 % — ABNORMAL LOW (ref 36.0–46.0)
Hemoglobin: 9.1 g/dL — ABNORMAL LOW (ref 12.0–15.0)
MCH: 30.3 pg (ref 26.0–34.0)
MCHC: 34.1 g/dL (ref 30.0–36.0)
MCV: 89 fL (ref 78.0–100.0)
PLATELETS: 153 10*3/uL (ref 150–400)
RBC: 3 MIL/uL — AB (ref 3.87–5.11)
RDW: 14.4 % (ref 11.5–15.5)
WBC: 4.6 10*3/uL (ref 4.0–10.5)

## 2013-07-26 LAB — CBC WITH DIFFERENTIAL/PLATELET
Basophils Absolute: 0 10*3/uL (ref 0.0–0.1)
Basophils Relative: 0 % (ref 0–1)
EOS ABS: 0 10*3/uL (ref 0.0–0.7)
EOS PCT: 0 % (ref 0–5)
HCT: 37 % (ref 36.0–46.0)
HEMOGLOBIN: 12.5 g/dL (ref 12.0–15.0)
Lymphocytes Relative: 9 % — ABNORMAL LOW (ref 12–46)
Lymphs Abs: 1.2 10*3/uL (ref 0.7–4.0)
MCH: 30.3 pg (ref 26.0–34.0)
MCHC: 33.8 g/dL (ref 30.0–36.0)
MCV: 89.6 fL (ref 78.0–100.0)
MONOS PCT: 4 % (ref 3–12)
Monocytes Absolute: 0.5 10*3/uL (ref 0.1–1.0)
Neutro Abs: 11.4 10*3/uL — ABNORMAL HIGH (ref 1.7–7.7)
Neutrophils Relative %: 87 % — ABNORMAL HIGH (ref 43–77)
Platelets: 161 10*3/uL (ref 150–400)
RBC: 4.13 MIL/uL (ref 3.87–5.11)
RDW: 14.6 % (ref 11.5–15.5)
WBC: 13.1 10*3/uL — ABNORMAL HIGH (ref 4.0–10.5)

## 2013-07-26 LAB — COMPREHENSIVE METABOLIC PANEL
ALK PHOS: 95 U/L (ref 39–117)
ALT: 1254 U/L — AB (ref 0–35)
AST: 1601 U/L — ABNORMAL HIGH (ref 0–37)
Albumin: 2.4 g/dL — ABNORMAL LOW (ref 3.5–5.2)
BILIRUBIN TOTAL: 0.5 mg/dL (ref 0.3–1.2)
BUN: 24 mg/dL — ABNORMAL HIGH (ref 6–23)
CHLORIDE: 104 meq/L (ref 96–112)
CO2: 18 mEq/L — ABNORMAL LOW (ref 19–32)
Calcium: 7.4 mg/dL — ABNORMAL LOW (ref 8.4–10.5)
Creatinine, Ser: 1.13 mg/dL — ABNORMAL HIGH (ref 0.50–1.10)
GFR calc non Af Amer: 47 mL/min — ABNORMAL LOW (ref >90)
GFR, EST AFRICAN AMERICAN: 54 mL/min — AB (ref >90)
Glucose, Bld: 198 mg/dL — ABNORMAL HIGH (ref 70–99)
Potassium: 3.9 mEq/L (ref 3.7–5.3)
SODIUM: 139 meq/L (ref 137–147)
TOTAL PROTEIN: 5.1 g/dL — AB (ref 6.0–8.3)

## 2013-07-26 LAB — I-STAT CG4 LACTIC ACID, ED: Lactic Acid, Venous: 4.23 mmol/L — ABNORMAL HIGH (ref 0.5–2.2)

## 2013-07-26 LAB — GLUCOSE, CAPILLARY: Glucose-Capillary: 175 mg/dL — ABNORMAL HIGH (ref 70–99)

## 2013-07-26 LAB — CLOSTRIDIUM DIFFICILE BY PCR: Toxigenic C. Difficile by PCR: NEGATIVE

## 2013-07-26 LAB — MRSA PCR SCREENING: MRSA by PCR: NEGATIVE

## 2013-07-26 LAB — MAGNESIUM: Magnesium: 1.7 mg/dL (ref 1.5–2.5)

## 2013-07-26 LAB — LACTIC ACID, PLASMA: Lactic Acid, Venous: 4.3 mmol/L — ABNORMAL HIGH (ref 0.5–2.2)

## 2013-07-26 MED ORDER — SODIUM CHLORIDE 0.9 % IV SOLN
INTRAVENOUS | Status: DC
  Administered 2013-07-26 (×2): via INTRAVENOUS
  Administered 2013-07-28: 10 mL/h via INTRAVENOUS

## 2013-07-26 MED ORDER — NOREPINEPHRINE BITARTRATE 1 MG/ML IJ SOLN
2.0000 ug/min | INTRAMUSCULAR | Status: DC
  Administered 2013-07-26: 5 ug/min via INTRAVENOUS
  Filled 2013-07-26: qty 4

## 2013-07-26 MED ORDER — VANCOMYCIN 50 MG/ML ORAL SOLUTION
125.0000 mg | Freq: Four times a day (QID) | ORAL | Status: DC
  Filled 2013-07-26 (×9): qty 2.5

## 2013-07-26 MED ORDER — SODIUM CHLORIDE 0.9 % IV BOLUS (SEPSIS)
1000.0000 mL | Freq: Once | INTRAVENOUS | Status: AC
  Administered 2013-07-26: 1000 mL via INTRAVENOUS

## 2013-07-26 MED ORDER — PIPERACILLIN-TAZOBACTAM 3.375 G IVPB 30 MIN
3.3750 g | Freq: Once | INTRAVENOUS | Status: AC
  Administered 2013-07-26: 3.375 g via INTRAVENOUS
  Filled 2013-07-26: qty 50

## 2013-07-26 MED ORDER — PHENYLEPHRINE HCL 10 MG/ML IJ SOLN
30.0000 ug/min | Freq: Once | INTRAVENOUS | Status: AC
  Administered 2013-07-26: 30 ug/min via INTRAVENOUS
  Filled 2013-07-26: qty 1

## 2013-07-26 MED ORDER — SODIUM CHLORIDE 0.9 % IV SOLN: 500.0000 mg | INTRAVENOUS | Status: DC

## 2013-07-26 MED ORDER — PIPERACILLIN-TAZOBACTAM IN DEX 2-0.25 GM/50ML IV SOLN: 2.2500 g | Freq: Four times a day (QID) | INTRAVENOUS | Status: DC

## 2013-07-26 MED ORDER — SODIUM CHLORIDE 0.9 % IV SOLN
1000.0000 mL | INTRAVENOUS | Status: DC
  Administered 2013-07-26: 1000 mL via INTRAVENOUS

## 2013-07-26 MED ORDER — LACTATED RINGERS IV SOLN
INTRAVENOUS | Status: AC
  Administered 2013-07-26: 03:00:00 via INTRAVENOUS

## 2013-07-26 MED ORDER — FENTANYL CITRATE 0.05 MG/ML IJ SOLN: 25.0000 ug | INTRAMUSCULAR | Status: DC | PRN

## 2013-07-26 MED ORDER — LORAZEPAM 2 MG/ML IJ SOLN
0.5000 mg | INTRAMUSCULAR | Status: DC
  Administered 2013-07-26 – 2013-08-02 (×40): 0.5 mg via INTRAVENOUS
  Filled 2013-07-26 (×42): qty 1

## 2013-07-26 MED ORDER — INSULIN ASPART 100 UNIT/ML ~~LOC~~ SOLN: 1.0000 [IU] | SUBCUTANEOUS | Status: DC

## 2013-07-26 MED ORDER — PIPERACILLIN-TAZOBACTAM 3.375 G IVPB
3.3750 g | Freq: Three times a day (TID) | INTRAVENOUS | Status: DC
  Filled 2013-07-26 (×2): qty 50

## 2013-07-26 MED ORDER — ACETAMINOPHEN 650 MG RE SUPP: 650.0000 mg | Freq: Once | RECTAL | Status: DC

## 2013-07-26 MED ORDER — ACETAMINOPHEN 650 MG RE SUPP
650.0000 mg | RECTAL | Status: DC | PRN
  Administered 2013-07-26 – 2013-07-28 (×4): 650 mg via RECTAL
  Filled 2013-07-26 (×4): qty 1

## 2013-07-26 MED ORDER — METRONIDAZOLE IN NACL 5-0.79 MG/ML-% IV SOLN
500.0000 mg | Freq: Three times a day (TID) | INTRAVENOUS | Status: DC
  Administered 2013-07-26 – 2013-07-27 (×3): 500 mg via INTRAVENOUS
  Filled 2013-07-26 (×6): qty 100

## 2013-07-26 MED ORDER — LORAZEPAM 2 MG/ML IJ SOLN: 1.0000 mg | INTRAMUSCULAR | Status: DC

## 2013-07-26 MED ORDER — MORPHINE SULFATE 2 MG/ML IJ SOLN
2.0000 mg | INTRAMUSCULAR | Status: DC | PRN
  Administered 2013-07-27 – 2013-08-01 (×6): 2 mg via INTRAVENOUS
  Filled 2013-07-26 (×7): qty 1

## 2013-07-26 MED ORDER — LEVOTHYROXINE SODIUM 100 MCG IV SOLR
12.5000 ug | Freq: Every day | INTRAVENOUS | Status: DC
  Administered 2013-07-26 – 2013-07-28 (×3): 12.5 ug via INTRAVENOUS
  Filled 2013-07-26 (×5): qty 5

## 2013-07-26 MED ORDER — SODIUM CHLORIDE 0.9 % IV SOLN
1000.0000 mL | Freq: Once | INTRAVENOUS | Status: AC
  Administered 2013-07-26: 1000 mL via INTRAVENOUS

## 2013-07-26 MED ORDER — VANCOMYCIN HCL IN DEXTROSE 1-5 GM/200ML-% IV SOLN
1000.0000 mg | Freq: Once | INTRAVENOUS | Status: AC
  Administered 2013-07-26: 1000 mg via INTRAVENOUS
  Filled 2013-07-26: qty 200

## 2013-07-26 NOTE — ED Notes (Signed)
Bear Hugger placed on pt. Medium setting.

## 2013-07-26 NOTE — Progress Notes (Signed)
eLink Physician-Brief Progress Note Patient Name: Krista [name] DOB: 07/28/1940 MRN: [id number]  Date of Service  07/26/2013   HPI/Events of Note  Family is requesting pain medication for patient admitted with concern for sepsis syndrome and a limited code of pressors only.     eICU Interventions  Plan: Fentanyl 25 to 50 mcg IV q2 hours prn pain Rounding MD to discuss transition to comfort care in AM   Intervention Category Intermediate Interventions: Pain - evaluation and management  DETERDING,ELIZABETH 07/26/2013, 6:03 AM

## 2013-07-26 NOTE — ED Notes (Addendum)
MD to bedside. Level 2 Code Sepsis initiated.

## 2013-07-26 NOTE — Progress Notes (Addendum)
NURSING PROGRESS NOTE  Lawanda Cousinseggy Riss [id number] Transfer Data: 07/26/2013 5:24 PM Attending Provider: Storm FriskPatrick E Wright, MD WUJ:[id number]:Javier Sharen HonesGutierrez, MD Code Status: DNR  Lawanda Cousinseggy Burch is a 73 y.o. female patient transferred from 965m   Blood pressure 60/38, pulse 65, temperature 97.6 F (36.4 C), temperature source Oral, resp. rate 20, height 5\' 5"  (1.651 m), weight 45.3 kg (99 lb 13.9 oz), SpO2 88.00%.   IV Fluids: L hand KVO. And R A/C SL  Allergies:  Exelon and Sulfa antibiotics  Past Medical History:   has a past medical history of Hypertension; Hypothyroidism; HLD (hyperlipidemia); Osteoporosis; Dementia; Vitamin D deficiency; RLS (restless legs syndrome); Allergic rhinitis; Incontinence; UTI (urinary tract infection); History of asthma; Depression; GAD (generalized anxiety disorder); History of CVA (cerebrovascular accident); Hospice care patient; and DNR (do not resuscitate).  Past Surgical History:   has past surgical history that includes MRI/MRA (2010); sleep study (2010); dexa (11/2011); and dexa (06/2009).  Social History:   reports that she has been passively smoking.  She has never used smokeless tobacco. She reports that she does not drink alcohol or use illicit drugs.  Skin: Skin tears noted, bottom red, foam inplace. May refer to documentation on epic.    Side rails up x 2, fall assessment and education completed with patient/family. Patient/family able to verbalize understanding of risk associated with falls and verbalized understanding to call for assistance before getting out of bed. Call light within reach. Patient/family able to voice and demonstrate understanding of unit orientation instructions.  Comfort measures maintained.   Will continue to evaluate and treat per MD orders.

## 2013-07-26 NOTE — ED Provider Notes (Signed)
CSN: [id number]     Arrival date & time 07/26/13  0020 History   First MD Initiated Contact with Patient 07/26/13 0033     Chief Complaint  Patient presents with  . Hypotension     (Consider location/radiation/quality/duration/timing/severity/associated sxs/prior Treatment) The history is provided by the nursing home and the EMS personnel. The history is limited by the condition of the patient (Dementia).   73 year old female was transferred from a nursing home following a seizure. She was noted to be febrile with temperature 105.8 and hypotensive with blood pressure 70 systolic. EMS started IV hydration. Patient has severe dementia and is unable to give any history. She is DO NOT RESUSCITATE.  Past Medical History  Diagnosis Date  . Hypertension   . Hypothyroidism   . HLD (hyperlipidemia)   . Osteoporosis   . Dementia   . Vitamin D deficiency   . RLS (restless legs syndrome)   . Allergic rhinitis   . Incontinence     Bowel and bladder  . Hx: UTI (urinary tract infection)   . History of asthma   . Depression   . GAD (generalized anxiety disorder)   . History of CVA (cerebrovascular accident)     old by CT scan 2014  . Hospice care patient     DNR/DNI  . DNR (do not resuscitate)    Past Surgical History  Procedure Laterality Date  . Mri/mra  2010    WNL  . Sleep study  2010  . Dexa  11/2011    f/u 2 yrs?  . Dexa  06/2009    Tscore spine -0.58, hip -2.67   Family History  Problem Relation Age of Onset  . CAD Father 2468    CABG  . Dementia Father 3169  . Kidney disease Mother   . Cancer Paternal Aunt     breast and pancras   History  Substance Use Topics  . Smoking status: Passive Smoke Exposure - Never Smoker  . Smokeless tobacco: Never Used     Comment: Daily exposure  . Alcohol Use: No   OB History   Grav Para Term Preterm Abortions TAB SAB Ect Mult Living                 Review of Systems  Unable to perform ROS     Allergies  Exelon and Sulfa  antibiotics  Home Medications   Current Outpatient Rx  Name  Route  Sig  Dispense  Refill  . aspirin EC 81 MG tablet   Oral   Take 81 mg by mouth daily.         Marland Kitchen. atorvastatin (LIPITOR) 80 MG tablet   Oral   Take 40 mg by mouth daily.         Marland Kitchen. b complex vitamins capsule   Oral   Take 1 capsule by mouth daily.         . Calcium Carbonate-Vitamin D (CALCIUM 600 + D PO)   Oral   Take 1 tablet by mouth daily.         . clonazePAM (KLONOPIN) 0.5 MG tablet   Oral   Take 1 tablet (0.5 mg total) by mouth at bedtime as needed for anxiety.   30 tablet   0   . divalproex (DEPAKOTE SPRINKLE) 125 MG capsule   Oral   Take 1 capsule (125 mg total) by mouth 2 (two) times daily.   60 capsule   1   . feeding supplement (ENSURE COMPLETE)  LIQD   Oral   Take 237 mLs by mouth 2 (two) times daily between meals.          Marland Kitchen levothyroxine (SYNTHROID, LEVOTHROID) 25 MCG tablet   Oral   Take 1 tablet (25 mcg total) by mouth daily before breakfast.   90 tablet   0   . pramipexole (MIRAPEX) 0.25 MG tablet   Oral   Take 1 tablet (0.25 mg total) by mouth at bedtime.   90 tablet   3   . sertraline (ZOLOFT) 50 MG tablet   Oral   Take 1 tablet (50 mg total) by mouth daily.   30 tablet   3     Hospice patient   . STARCH-MALTO DEXTRIN (THICK-IT) POWD   Oral   Take 1 scoop by mouth as directed.         . traZODone (DESYREL) 50 MG tablet   Oral   Take 1.5 tablets (75 mg total) by mouth at bedtime.   45 tablet   6   . triamcinolone cream (KENALOG) 0.1 %   Topical   Apply 1 application topically 2 (two) times daily as needed (for diaper rash).          BP 130/88  Pulse 108  Temp(Src) 103.3 F (39.6 C) (Rectal)  Resp 31  SpO2 100% Physical Exam  Nursing note and vitals reviewed.  Chronically ill appearing 73 year old female, cachectic, with open-mouth breathing and on and not her breathing mask, but is in no acute respiratory distress. Vital signs are  significant for tachycardia with heart rate 108, and fever with temperature 103.3, and tachypnea with respiratory rate of 31. Oxygen saturation is 100%, which is normal. She had a large amount of diarrhea on presentation. Head is normocephalic and atraumatic. PERRLA, EOMI. Oropharynx is clear. Neck is nontender and supple without adenopathy or JVD. Back is nontender and there is no CVA tenderness. Lungs have coarse rhonchi throughout but without rales, wheezes, or rhonchi. Chest is nontender. Heart has regular rate and rhythm without murmur. Abdomen is soft, flat, nontender without masses or hepatosplenomegaly and peristalsis is normoactive. Extremities have no cyanosis or edema, full range of motion is present. Skin is warm and dry without rash. There are no areas of skin breakdown. Neurologic: She is awake but poorly responsive, cranial nerves are grossly intact, there are no gross motor or sensory deficits.  ED Course  Procedures (including critical care time) Labs Review Results for orders placed during the hospital encounter of 07/26/13  CBC      Result Value Ref Range   WBC 4.6  4.0 - 10.5 K/uL   RBC 3.00 (*) 3.87 - 5.11 MIL/uL   Hemoglobin 9.1 (*) 12.0 - 15.0 g/dL   HCT 16.1 (*) 09.6 - 04.5 %   MCV 89.0  78.0 - 100.0 fL   MCH 30.3  26.0 - 34.0 pg   MCHC 34.1  30.0 - 36.0 g/dL   RDW 40.9  81.1 - 91.4 %   Platelets 153  150 - 400 K/uL  COMPREHENSIVE METABOLIC PANEL      Result Value Ref Range   Sodium 141  137 - 147 mEq/L   Potassium 4.1  3.7 - 5.3 mEq/L   Chloride 107  96 - 112 mEq/L   CO2 19  19 - 32 mEq/L   Glucose, Bld 166 (*) 70 - 99 mg/dL   BUN 22  6 - 23 mg/dL   Creatinine, Ser 7.82 (*) 0.50 -  1.10 mg/dL   Calcium 7.4 (*) 8.4 - 10.5 mg/dL   Total Protein 4.2 (*) 6.0 - 8.3 g/dL   Albumin 2.1 (*) 3.5 - 5.2 g/dL   AST 811 (*) 0 - 37 U/L   ALT 111 (*) 0 - 35 U/L   Alkaline Phosphatase 54  39 - 117 U/L   Total Bilirubin 0.2 (*) 0.3 - 1.2 mg/dL   GFR calc non Af Amer  43 (*) >90 mL/min   GFR calc Af Amer 50 (*) >90 mL/min  DIFFERENTIAL      Result Value Ref Range   Neutrophils Relative % 80 (*) 43 - 77 %   Lymphocytes Relative 15  12 - 46 %   Monocytes Relative 5  3 - 12 %   Eosinophils Relative 0  0 - 5 %   Basophils Relative 0  0 - 1 %   Neutro Abs 3.7  1.7 - 7.7 K/uL   Lymphs Abs 0.7  0.7 - 4.0 K/uL   Monocytes Absolute 0.2  0.1 - 1.0 K/uL   Eosinophils Absolute 0.0  0.0 - 0.7 K/uL   Basophils Absolute 0.0  0.0 - 0.1 K/uL  I-STAT CG4 LACTIC ACID, ED      Result Value Ref Range   Lactic Acid, Venous 4.23 (*) 0.5 - 2.2 mmol/L   Imaging Review Dg Chest Port 1 View  (if Code Sepsis Called)  07/26/2013   CLINICAL DATA:  Hypotension.  EXAM: PORTABLE CHEST - 1 VIEW  COMPARISON:  DG CHEST 2 VIEW dated 05/15/2013; DG CHEST 1V PORT dated 10/09/2012; DG CHEST 2 VIEW dated 07/13/2008; DG CHEST 2 VIEW dated 02/06/2005  FINDINGS: Cardiac silhouette normal in size for AP portable technique. Thoracic aorta atherosclerotic, unchanged. Lungs clear. Bronchovascular markings normal. Pulmonary vascularity normal. No visible pleural effusions. No pneumothorax.  IMPRESSION: No acute cardiopulmonary disease.   Electronically Signed   By: Hulan Saas M.D.   On: 07/26/2013 01:40   CT scan has been completed and shows atrophy but no other findings per my reading. Radiology interpretation is pending.  Images viewed by me.  CRITICAL CARE Performed by: BJYNW,GNFAO Total critical care time: 110 minutes Critical care time was exclusive of separately billable procedures and treating other patients. Critical care was necessary to treat or prevent imminent or life-threatening deterioration. Critical care was time spent personally by me on the following activities: development of treatment plan with patient and/or surrogate as well as nursing, discussions with consultants, evaluation of patient's response to treatment, examination of patient, obtaining history from patient or  surrogate, ordering and performing treatments and interventions, ordering and review of laboratory studies, ordering and review of radiographic studies, pulse oximetry and re-evaluation of patient's condition.  MDM   Final diagnoses:  Septic shock  Seizure  Anemia  Hyponatremia    Fever with seizure. Hypotension in route appears to have improved with hydration. Level II code sepsis is called. Septic workup was initiated including blood cultures, chest x-ray, urinalysis. Stool samples sent for Clostridium difficile testing. Because of seizure, she will be sent for CT of head.  1:10 AM Blood pressure has dropped to 58 systolic while continuing IV hydration and early goal-directed fluid treatment. Chest x-ray was read by myself as showing no clear infiltrate. Urinalysis is still pending. She started on antibiotics for sepsis of in the source we'll and is given vancomycin and Zosyn. Because of DO NOT RESUSCITATE status, she will be given fluid management but not intravenous pressors.  2:11 AM  In spite of 3 L of intravenous fluids, the patient's blood pressure is hovering in the mid 60s. Family has arrived and have stated that they would like patient to get pressors but do not wish central line or intubation. Case is discussed with Dr. Darrick Penna of pulmonary critical care service who agrees to admit the patient.  Dione Booze, MD 07/26/13 [phone]

## 2013-07-26 NOTE — ED Notes (Addendum)
Pt arrived by ems from [facility]. Pt actively seizing upon ems arrival. Pt temporal temp 105.8, pt bit tongue during seizure (blood to gown). Hx Alzheimer/demintia. Pt is a hospice pt and DNR. 2.5mg  versed administered by ems pta

## 2013-07-26 NOTE — H&P (Signed)
PULMONARY  / CRITICAL CARE MEDICINE  Name: Krista [name] MRN: [id number] DOB: Mar 14, 1941    ADMISSION DATE:  07/26/2013 CONSULTATION DATE:  3/22  REFERRING MD :  ED PRIMARY SERVICE: Pulm CCM  CHIEF COMPLAINT:  Severe Septic Shock  BRIEF PATIENT DESCRIPTION: 73 yo female, residing in respite care due to care givers recent surgery, who presents from facility with AMS, temp of 105, diarrhea, and multiorgan failure with shock.  SIGNIFICANT EVENTS / STUDIES:  CT 3/22 IMPRESSION:  1. No acute intracranial process.  2. Advanced atrophy with chronic microvascular ischemic disease,  unchanged.  CXR Clear, no acute infiltrates  LINES / TUBES: None  CULTURES: Pending blood Unable to obtain urine ANTIBIOTICS: Vanc (oral and IV), flagyl, and Zosyn  HISTORY OF PRESENT ILLNESS:  73 yo female, residing in respite care due to care givers recent surgery, who presents from facility with AMS, temp of 105, diarrhea, and multiorgan failure with shock.  Patient last seen 72 hours ago by daughter who reports she was talking and cognizant (although confused at times).  Seen 24-48 hours ago by family friend who stated the patient was doing well.  Rapid decline with patient "coughing up blood" at the nursing home.  EMS in route reports that patient biting her tongue with trembling throughout her body.  In ED patient without further evidence of seizure disorder, however was noted to be very hypotensive despite aggressive fluid therapy.  Patient was non verbal, unresponsive to painful stimuli.  Received 3 liters NS without improvement of hemodynamics. Given Vanc and Zosyn.  Family reports she is a DNR, does not want invasive procedures, but request that pressors be given (the risks of given peripheral vasopressors was explained and they were agreeable).  Initially levo was started, neo added with MAP in the low 60's.    At the time of exam, patient non verbal, anuric with AKI on labs (minimally elevated, however  for patient's body habitus/cachexia, fairly severe), transamitis, and lactic acidosis.  Large volume of watery stool while being examined by ed staff.  No evidence of active oral bleeding or hemoptysis.  Patient is unable to walk without heavy assistance, unable to perform adl's due to advanced dementia, and is non verbal the majority of the time  PMH significant for hypothyroidism, severe dementia, CVA, HLD, incontence, distant c. Diff infection, and respite care placement.  PAST MEDICAL HISTORY :  Past Medical History  Diagnosis Date  . Hypertension   . Hypothyroidism   . HLD (hyperlipidemia)   . Osteoporosis   . Dementia   . Vitamin D deficiency   . RLS (restless legs syndrome)   . Allergic rhinitis   . Incontinence     Bowel and bladder  . Hx: UTI (urinary tract infection)   . History of asthma   . Depression   . GAD (generalized anxiety disorder)   . History of CVA (cerebrovascular accident)     old by CT scan 2014  . Hospice care patient     DNR/DNI  . DNR (do not resuscitate)    Past Surgical History  Procedure Laterality Date  . Mri/mra  2010    WNL  . Sleep study  2010  . Dexa  11/2011    f/u 2 yrs?  . Dexa  06/2009    Tscore spine -0.58, hip -2.67   Prior to Admission medications   Medication Sig Start Date End Date Taking? Authorizing Provider  acetaminophen (TYLENOL) 500 MG tablet Take 1,000 mg by mouth every  6 (six) hours as needed for mild pain, fever or headache.   Yes Historical Provider, MD  atorvastatin (LIPITOR) 40 MG tablet Take 40 mg by mouth daily.   Yes Historical Provider, MD  clonazePAM (KLONOPIN) 0.5 MG tablet Take 0.5 mg by mouth at bedtime as needed for anxiety. 07/06/13  Yes Joaquim NamGraham S Duncan, MD  feeding supplement (ENSURE COMPLETE) LIQD Take 237 mLs by mouth 2 (two) times daily between meals.  10/10/12  Yes Nishant Dhungel, MD  levothyroxine (SYNTHROID, LEVOTHROID) 25 MCG tablet Take 25 mcg by mouth daily before breakfast. 04/15/13  Yes Eustaquio BoydenJavier  Gutierrez, MD  pramipexole (MIRAPEX) 0.25 MG tablet Take 0.25 mg by mouth at bedtime. 11/11/12  Yes Eustaquio BoydenJavier Gutierrez, MD  sertraline (ZOLOFT) 50 MG tablet Take 1 tablet (50 mg total) by mouth daily. 06/29/13  Yes Eustaquio BoydenJavier Gutierrez, MD  STARCH-MALTO DEXTRIN [facility](THICK-IT) POWD Take 1 scoop by mouth as directed.   Yes Historical Provider, MD  traZODone (DESYREL) 50 MG tablet Take 75 mg by mouth at bedtime. 03/09/13  Yes Eustaquio BoydenJavier Gutierrez, MD   Allergies  Allergen Reactions  . Exelon [Rivastigmine Tartrate] Other (See Comments)    Patch caused skin rash  . Sulfa Antibiotics Other (See Comments)    Unknown reaction per pt's daughters    FAMILY HISTORY:  Family History  Problem Relation Age of Onset  . CAD Father 6668    CABG  . Dementia Father 469  . Kidney disease Mother   . Cancer Paternal Aunt     breast and pancras   SOCIAL HISTORY:  reports that she has been passively smoking.  She has never used smokeless tobacco. She reports that she does not drink alcohol or use illicit drugs.  REVIEW OF SYSTEMS:  Unable to complete due to obtundation  SUBJECTIVE:   VITAL SIGNS: Temp:  [96.6 F (35.9 C)-103.3 F (39.6 C)] 96.6 F (35.9 C) (03/22 0255) Pulse Rate:  [59-108] 61 (03/22 0255) Resp:  [15-33] 19 (03/22 0255) BP: (52-130)/(32-99) 92/53 mmHg (03/22 0255) SpO2:  [88 %-100 %] 90 % (03/22 0255) HEMODYNAMICS:   VENTILATOR SETTINGS:   INTAKE / OUTPUT: Intake/Output     03/21 0701 - 03/22 0700   I.V. 3250   Total Intake 3250   Total Output 0   Net +3250         PHYSICAL EXAMINATION: General:  NAD, obtunded, in florid shock, was 105 degrees in the ED initially, now 36 Neuro:  Non verbal, does not respond to pain HEENT:  Oral mucosa dry, bloody from tongue biting, no distended neck veins, conjunctiva without pallor  Cardiovascular:  Bradycardic,  Lungs:  Rhonchirous, no rales, wheezes, good air movement Abdomen:  Non tender, non distended, normal bowel sounds throughout  Skin:   Small serous bullae on left heal, no skin break down otherwise, no rash  LABS:  Recent Labs Lab 07/26/13 0055 07/26/13 0119  HGB 9.1*  --   WBC 4.6  --   PLT 153  --   NA 141  --   K 4.1  --   CL 107  --   CO2 19  --   GLUCOSE 166*  --   BUN 22  --   CREATININE 1.21*  --   CALCIUM 7.4*  --   AST 127*  --   ALT 111*  --   ALKPHOS 54  --   BILITOT 0.2*  --   PROT 4.2*  --   ALBUMIN 2.1*  --   LATICACIDVEN  --  4.23*   No results found for this basename: GLUCAP,  in the last 168 hours  CXR: Clear without acute cardiopulmonary findings Bedside ultrasound:  Patient with L>R b lines consistent with pulmonary edema, no evidence of consolidation.  ECHO with mild decrease in LVEF, RV without dysfunction, 25% collapse of IVC with respirations (not intubated)  ASSESSMENT / PLAN:  PULMONARY A:  Supportive Care P:   No intubation [place] to keep SpO2 >92%  CARDIOVASCULAR A:  Severe Septic shock Likely from a UTI or C. Diff Has undergone 3 liters of NS (>70cc/kg fluid resuscitation) however some collapse of IVC Patient in multiorgan failure, hypothermic, becoming bradycardic   P:  Do not feel patient is a candidate for further aggressive fluid therapy without risk of pulmonary edema, respiratory compromise Will provide 1 liter over the next 10 hours, avoid bolusing  Starting neo peripherally as family refusing central line; titrate to a goal MAP 60-65 Vanc IV and down NG/Flagyl/Zosyn (renal dosed) Very poor prognosis, trend lactate and continue family discussion of reasonable goals of care Currently, will not escalate care  RENAL A:  AKI Acute Multifactorial Pre-Renal/Shock/Septic Nephropathy Currently anuric  P: Optimze hemodynamics MAP Goal >60-65 for adequate perfusion Foley for strict I/O's Avoid nephrotoxic substances Renally dose medications  GASTROINTESTINAL A:  Diarrhea Possibly C. Diff, history of c diff, living in respite care currently P:   See  above  HEMATOLOGIC A:  No issues P:  SCD's Avoiding heparin due to possible active oral bleeding  INFECTIOUS A:  See above, patient in severe septic shock P:   See above  ENDOCRINE A:  ICU Insulin protocol in place  A:  Hypothyroidism   P:   IV synthroid at 1/2 dose  NEUROLOGIC A:  Seizure activity Likely secondary to enecephalopathy, sepsis, and profound shock (prolonged inadequate MAP) P:   Ativan 0.5 mg as needed May need keppra if continues, high likelyhood due to shock and anoxia.  I have personally obtained a history, examined the patient, evaluated laboratory and imaging results, formulated the assessment and plan and placed orders. CRITICAL CARE: The patient is critically ill with multiple organ systems failure and requires high complexity decision making for assessment and support, frequent evaluation and titration of therapies, application of advanced monitoring technologies and extensive interpretation of multiple databases. Critical Care Time devoted to patient care services described in this note is 45 minutes.   Dorcas Carrow Pulmonary and Critical Care Medicine [facility] Pager: [phone]  07/26/2013, 2:59 AM

## 2013-07-26 NOTE — Progress Notes (Signed)
dcd contact precautions. cdiff negative.

## 2013-07-26 NOTE — ED Notes (Signed)
No urine return at this time. Will continue with sepsis protocol.

## 2013-07-26 NOTE — ED Notes (Signed)
Xray to bedside.

## 2013-07-26 NOTE — Progress Notes (Signed)
Patient admitted to 2MW ICU, patient a DNR, DNI. Family spoke with Dr. Ala DachFord and agreed to vasopressors, but no cental line placement per family request.  Pt. On NRB with SpO2 in the 70's to 80's, family educated in current situation. MD verbal order to stop continous fluids. Fine crackles auscultated bilateral lower lobes with very low urine output, patient came in tracking with eyes but nonverbal. Family asked if they would like to speak with a chaplain for which family states "no".  Corliss SkainsJuan Darrion Wyszynski RN

## 2013-07-26 NOTE — ED Notes (Signed)
Dr Preston FleetingGlick given a copy of lactic acid results 4.23

## 2013-07-26 NOTE — ED Notes (Addendum)
Pt transported to CT. Family arrived and updated on plan of care for pt.

## 2013-07-26 NOTE — ED Notes (Signed)
Dr. Preston FleetingGlick in to speak with family about wishes for care.

## 2013-07-26 NOTE — Progress Notes (Signed)
ANTIBIOTIC CONSULT NOTE - INITIAL  Pharmacy Consult for Vancomycin and Zosyn Indication: rule out sepsis  Allergies  Allergen Reactions  . Exelon [Rivastigmine Tartrate] Other (See Comments)    Patch caused skin rash  . Sulfa Antibiotics Other (See Comments)    Unknown reaction per pt's daughters    Patient Measurements: Height: 5\' 5"  (165.1 cm) Weight: 99 lb 13.9 oz (45.3 kg) IBW/kg (Calculated) : 57  Vital Signs: Temp: 97.4 F (36.3 C) (03/22 0415) Temp src: Rectal (03/22 0415) BP: 98/72 mmHg (03/22 0515) Pulse Rate: 94 (03/22 0515) Intake/Output from previous day: 03/21 0701 - 03/22 0700 In: 3515.4 [I.V.:3515.4] Out: 0  Intake/Output from this shift: Total I/O In: 3515.4 [I.V.:3515.4] Out: 0   Labs:  Recent Labs  07/26/13 0055 07/26/13 0535  WBC 4.6 13.1*  HGB 9.1* 12.5  PLT 153 161  CREATININE 1.21* 1.13*   Estimated Creatinine Clearance: 31.7 ml/min (by C-G formula based on Cr of 1.13). No results found for this basename: VANCOTROUGH, VANCOPEAK, VANCORANDOM, GENTTROUGH, GENTPEAK, GENTRANDOM, TOBRATROUGH, TOBRAPEAK, TOBRARND, AMIKACINPEAK, AMIKACINTROU, AMIKACIN,  in the last 72 hours   Microbiology: No results found for this or any previous visit (from the past 720 hour(s)).  Medical History: Past Medical History  Diagnosis Date  . Hypertension   . Hypothyroidism   . HLD (hyperlipidemia)   . Osteoporosis   . Dementia   . Vitamin D deficiency   . RLS (restless legs syndrome)   . Allergic rhinitis   . Incontinence     Bowel and bladder  . Hx: UTI (urinary tract infection)   . History of asthma   . Depression   . GAD (generalized anxiety disorder)   . History of CVA (cerebrovascular accident)     old by CT scan 2014  . Hospice care patient     DNR/DNI  . DNR (do not resuscitate)     Medications:  Prescriptions prior to admission  Medication Sig Dispense Refill  . acetaminophen (TYLENOL) 500 MG tablet Take 1,000 mg by mouth every 6 (six)  hours as needed for mild pain, fever or headache.      Marland Kitchen. atorvastatin (LIPITOR) 40 MG tablet Take 40 mg by mouth daily.      . clonazePAM (KLONOPIN) 0.5 MG tablet Take 0.5 mg by mouth at bedtime as needed for anxiety.      . feeding supplement (ENSURE COMPLETE) LIQD Take 237 mLs by mouth 2 (two) times daily between meals.       Marland Kitchen. levothyroxine (SYNTHROID, LEVOTHROID) 25 MCG tablet Take 25 mcg by mouth daily before breakfast.      . pramipexole (MIRAPEX) 0.25 MG tablet Take 0.25 mg by mouth at bedtime.      . sertraline (ZOLOFT) 50 MG tablet Take 1 tablet (50 mg total) by mouth daily.  30 tablet  3  . STARCH-MALTO DEXTRIN (THICK-IT) POWD Take 1 scoop by mouth as directed.      . traZODone (DESYREL) 50 MG tablet Take 75 mg by mouth at bedtime.       Assessment: 73 yo female with AMS/fevers/hypotension, possible sepsis, for empiric antibiotics.  Vancomycin 1 g IV given in ED at 0115  Goal of Therapy:  Vancomycin trough level 15-20 mcg/ml  Plan:  Vancomycin 500 mg IV q48h Zosyn 3.375 g IV q8h  Drake Wuertz, Gary FleetGregory Vernon 07/26/2013,6:55 AM

## 2013-07-26 NOTE — ED Notes (Addendum)
Pt upgraded to Level one code sepsis.

## 2013-07-26 NOTE — ED Notes (Signed)
Critical care MD at bedside 

## 2013-07-26 NOTE — Progress Notes (Signed)
PCCM  Pt declining rapidly.  Family is ok with comfort measures only status now.  See orders.  Luisa HartPatrick WrightMD

## 2013-07-26 NOTE — Progress Notes (Signed)
Utilization review completed.  

## 2013-07-27 DIAGNOSIS — N390 Urinary tract infection, site not specified: Secondary | ICD-10-CM

## 2013-07-27 DIAGNOSIS — A419 Sepsis, unspecified organism: Principal | ICD-10-CM

## 2013-07-27 DIAGNOSIS — Z515 Encounter for palliative care: Secondary | ICD-10-CM

## 2013-07-27 DIAGNOSIS — R6521 Severe sepsis with septic shock: Secondary | ICD-10-CM

## 2013-07-27 DIAGNOSIS — R6520 Severe sepsis without septic shock: Secondary | ICD-10-CM

## 2013-07-27 DIAGNOSIS — E039 Hypothyroidism, unspecified: Secondary | ICD-10-CM

## 2013-07-27 LAB — URINE CULTURE
COLONY COUNT: NO GROWTH
Culture: NO GROWTH

## 2013-07-27 NOTE — Social Work (Signed)
[facility] SW visited pt and daughter, Krista [name]. Pt slept through entire visit, and appeared comfortable and pain free. However, she did exhibit restlessness, as evidenced by her moving her leg up against her chest and back down, multiple times. Krista [name] asked RN about pt's restless leg medication and whether pt could take it with other medicines, as she is concerned that it is affecting pt's rest. RN informed Krista [name] that she would ask the doctor, but most likely they would not make a change unless pt seemed to be uncomfortable. SW assessed plan, and Krista [name] stated that family would prefer to keep pt at the hospital since she has had such a rapid decline; she is worried that transporting pt would be stressful, and/or that pt will die alone in transport. SW assessed family coping. Daughter Krista [name] and Krista [name] are accepting and are coping effectively w/ anticipatory grief. Son, Krista [name] is "hoping for a miracle" and having a hard time, expressing anger that this could have been prevented, and that he previously thought pt was sick. Krista [name] works in [employer] and explained that pt's infection could not have been prevented. SW normalized Brian's anger as a way to cope w/ feelings of anticipatory grief and helplessness. SW provided emotional support, assessed fears, validated Kimberly's feelings, and informed her about [facility] bereavement services. [facility], Krista [name], as well as hospital SW w/ pt update.   Krista [name], LCSWA

## 2013-07-27 NOTE — Progress Notes (Signed)
Nutrition Brief Note  Chart reviewed. Pt now transitioning to comfort care.  No further nutrition interventions warranted at this time.  Please re-consult as needed.   Jodell Weitman MS, RD, LDN Inpatient Registered Dietitian Pager: [phone] After-hours pager: [phone]    

## 2013-07-27 NOTE — H&P (Signed)
PULMONARY  / CRITICAL CARE MEDICINE  Name: Krista [name] MRN: [id number] DOB: January 03, 1941    ADMISSION DATE:  07/26/2013 CONSULTATION DATE:  3/22  REFERRING MD :  EDP PRIMARY SERVICE: PCCM  CHIEF COMPLAINT:  Severe septic shock  BRIEF PATIENT DESCRIPTION: 73 yo female, residing in respite care due to care givers recent surgery, who presents from facility with AMS, temp of 105, diarrhea, and multiorgan failure with shock.  SUBJECTIVE: No acute overnight events  VITAL SIGNS: Temp:  [98.6 F (37 C)-102.4 F (39.1 C)] 99.5 F (37.5 C) (03/23 0839) Pulse Rate:  [65-89] 84 (03/23 0839) Resp:  [19-34] 21 (03/23 0839) BP: (51-107)/(34-69) 107/69 mmHg (03/23 0839) SpO2:  [88 %-100 %] 100 % (03/22 2124)  HEMODYNAMICS:   VENTILATOR SETTINGS:   INTAKE / OUTPUT: Intake/Output     03/22 0701 - 03/23 0700 03/23 0701 - 03/24 0700   I.V. (mL/kg) 80 (1.8)    Total Intake(mL/kg) 80 (1.8)    Total Output       Net +80           PHYSICAL EXAMINATION: General: obtunded, appears comfortable and in no respiratory distress.  Neuro:  Non verbal, does not respond to pain HEENT:  Oral mucosa dry, no JVD noted Cardiovascular:  RRR Lungs:  Rhonchirous, no rales, wheezes, good air movement Abdomen:  Non tender, non distended, normal bowel sounds throughout  Skin:  Small serous bullae on left heal, no skin break down otherwise, no rash  LABS: CBC  Recent Labs Lab 07/26/13 0055 07/26/13 0535  WBC 4.6 13.1*  HGB 9.1* 12.5  HCT 26.7* 37.0  PLT 153 161   Coag's No results found for this basename: APTT, INR,  in the last 168 hours BMET  Recent Labs Lab 07/26/13 0055 07/26/13 0535  NA 141 139  K 4.1 3.9  CL 107 104  CO2 19 18*  BUN 22 24*  CREATININE 1.21* 1.13*  GLUCOSE 166* 198*   Electrolytes  Recent Labs Lab 07/26/13 0055 07/26/13 0254 07/26/13 0535  CALCIUM 7.4*  --  7.4*  MG  --  1.7  --    Sepsis Markers  Recent Labs Lab 07/26/13 0119 07/26/13 0535   LATICACIDVEN 4.23* 4.3*   ABG No results found for this basename: PHART, PCO2ART, PO2ART,  in the last 168 hours Liver Enzymes  Recent Labs Lab 07/26/13 0055 07/26/13 0535  AST 127* 1601*  ALT 111* 1254*  ALKPHOS 54 95  BILITOT 0.2* 0.5  ALBUMIN 2.1* 2.4*   Cardiac Enzymes No results found for this basename: TROPONINI, PROBNP,  in the last 168 hours Glucose  Recent Labs Lab 07/26/13 0420  GLUCAP 175*   IMAGING: Ct Head Wo Contrast  07/26/2013   CLINICAL DATA:  Hypotensive, could sepsis  EXAM: CT HEAD WITHOUT CONTRAST  TECHNIQUE: Contiguous axial images were obtained from the base of the skull through the vertex without intravenous contrast.  COMPARISON:  Prior CT from 06/09/2013  FINDINGS: Advanced age-related atrophy with chronic microvascular ischemic changes noted. Prominent vascular calcifications noted within the carotid siphons bilaterally.  There is no acute intracranial hemorrhage or infarct. No mass lesion or midline shift. Gray-white matter differentiation is well maintained. Ventricles are normal in size without evidence of hydrocephalus. CSF containing spaces are within normal limits. No extra-axial fluid collection.  The calvarium is intact.  Orbital soft tissues are within normal limits.  The paranasal sinuses and mastoid air cells are well pneumatized and free of fluid.  Scalp soft tissues are  unremarkable.  IMPRESSION: 1. No acute intracranial process. 2. Advanced atrophy with chronic microvascular ischemic disease, unchanged.   Electronically Signed   By: Rise MuBenjamin  McClintock M.D.   On: 07/26/2013 02:32   Dg Chest Port 1 View  (if Code Sepsis Called)  07/26/2013   CLINICAL DATA:  Hypotension.  EXAM: PORTABLE CHEST - 1 VIEW  COMPARISON:  DG CHEST 2 VIEW dated 05/15/2013; DG CHEST 1V PORT dated 10/09/2012; DG CHEST 2 VIEW dated 07/13/2008; DG CHEST 2 VIEW dated 02/06/2005  FINDINGS: Cardiac silhouette normal in size for AP portable technique. Thoracic aorta atherosclerotic,  unchanged. Lungs clear. Bronchovascular markings normal. Pulmonary vascularity normal. No visible pleural effusions. No pneumothorax.  IMPRESSION: No acute cardiopulmonary disease.   Electronically Signed   By: Hulan Saashomas  Lawrence M.D.   On: 07/26/2013 01:40   ASSESSMENT / PLAN:  Septic shock AKI Diarrhea Hypothyroidism  Seizure activity Multiorgan failure   Full comfort measures  D/c Flagyl, PO vanc as C-diff PCR is negative  NPO  IV synthroid at 1/2 dose  Ativan 0.5 mg as needed  Morphing PRN for pain/respiratory distress  PCCM will sign off.  TR[facility] to assume care 3/24.  Joneen RoachPaul Hoffman, ACNP [facility] Pager [phone] or [phone]  07/27/2013, 10:20 AM  I have personally obtained history, examined patient, evaluated and interpreted laboratory and imaging results, reviewed medical records, formulated assessment / plan and placed orders.  Lonia FarberZUBELEVITSKIY, Truth Wolaver, MD Pulmonary and Critical Care Medicine [facility] Pager: [phone]  07/27/2013, 3:06 PM

## 2013-07-27 NOTE — Progress Notes (Signed)
Inpatient [facility] Rm 5W25 [facility] [place] [facility] RN Visit- M. Konrad DoloresLester, RN  [facility]heim[facility];  DNR code status   Pt seen at bedside, non-verbal, does not open eyes to voice or touch; draws knees up to chest when touched, intermittent R UE tremors noted, on 15 L NRBM - with gentle massage of extremities or face, RR increased to 20 with some grunting respirations noted, intermittent furrowing of her forehead and continued LE restlessness- pulling up of knees to chest restlessness - observations of increased discomfort discussed with daughter Cala BradfordKimberly at bedside and staff RN Mariella Saaoncetta both agree these s/sx have increased and agree with need for bolus dose of PRN Morphine, last dose approximately 7:30 am. Staff RN Concetta aware and will follow up.  Ongoing nursing assessment required for non-verbal signs of pain/discomfort  Daughter stated after speaking with physicians she realizes pt's time may be very short and has discussed with the doctors that the pt may have an in-hospital death; Cala BradfordKimberly voiced appreciation of staff support and care; she feels her mother's comfort at EOL is not only a priority for her family, but the staff caring for her as well. Emotional support offered to [place] and friend at bedside who reports she and Selena BattenKim have been friend since they were 73 yo and pt is like a second mother; family aware [facility] will continue to follow Patient's home medication list and transfer summary  is on shadow chart. Please call [facility] @ [phone]-  with any hospice needs.  Thank you.  Valente DavidMargie Jayzen Paver, RN   Hospice Liaison  [phone])

## 2013-07-28 DIAGNOSIS — D649 Anemia, unspecified: Secondary | ICD-10-CM

## 2013-07-28 DIAGNOSIS — N182 Chronic kidney disease, stage 2 (mild): Secondary | ICD-10-CM

## 2013-07-28 DIAGNOSIS — E43 Unspecified severe protein-calorie malnutrition: Secondary | ICD-10-CM

## 2013-07-28 DIAGNOSIS — R627 Adult failure to thrive: Secondary | ICD-10-CM

## 2013-07-28 MED ORDER — MORPHINE SULFATE 10 MG/ML IJ SOLN
2.0000 mg/h | INTRAMUSCULAR | Status: DC
  Administered 2013-07-28: 0.5 mg/h via INTRAVENOUS
  Administered 2013-07-29 – 2013-08-01 (×2): 1 mg/h via INTRAVENOUS
  Administered 2013-08-02: 2 mg/h via INTRAVENOUS
  Filled 2013-07-28 (×2): qty 10

## 2013-07-28 NOTE — Progress Notes (Signed)
PROGRESS NOTE  Krista [name] ZOX:[id number] DOB: [birth year] DOA: 07/26/2013 PCP: Eustaquio Boyden, MD  73 yo female with PMH of severe dementia (non-verbal), CVA, and distant c-diff infection,  admitted from SNF to ICU after coughing up blood, seizing and developing hypotension that did not respond to fluid resuscitation.  On admission her temp was 105, and she was in multiorgan failure with septic shock.  Assessment/Plan:  Septic shock Presumably from UTI On pressors in ICU Large volume watery stool in ER.  C-dff negative. Critical Care doctors and family determined that patient is not a candidate for further aggressive care. Hospice is on board and she is full comfort care. Low volume (0.5 mg/hour) morphine drip and ativan for comfort. Patient has a very poor prognosis and will likely pass in hours.  Severe Dementia Patient is bed ridden and non verbal. Cachectic and lying in fetal position. Family is at bedside.  UTI Antibiotics have been discontinued.  Hx of CVA  Severe mal nutrition   CKD     DVT Prophylaxis:  N/a  Full comfort care  Code Status: DNR Family Communication: daughter Krista [name] at bedside. Disposition Plan: very poor prognosis.  Patient is appropriate for GIP   Consultants:  Hospice / Palliative Medicine.   Antibiotics: Anti-infectives   Start     Dose/Rate Route Frequency Ordered Stop   07/28/13 0600  vancomycin (VANCOCIN) 500 mg in sodium chloride 0.9 % 100 mL IVPB  Status:  Discontinued     500 mg 100 mL/hr over 60 Minutes Intravenous Every 48 hours 07/26/13 0700 07/26/13 0828   07/26/13 1000  piperacillin-tazobactam (ZOSYN) IVPB 3.375 g  Status:  Discontinued     3.375 g 12.5 mL/hr over 240 Minutes Intravenous Every 8 hours 07/26/13 0324 07/26/13 0828   07/26/13 0600  piperacillin-tazobactam (ZOSYN) IVPB 2.25 g  Status:  Discontinued     2.25 g 100 mL/hr over 30 Minutes Intravenous 4 times per day 07/26/13 0259 07/26/13 0323   07/26/13 0600   vancomycin (VANCOCIN) 50 mg/mL oral solution 125 mg  Status:  Discontinued     125 mg Oral 4 times per day 07/26/13 0259 07/27/13 1152   07/26/13 0300  metroNIDAZOLE (FLAGYL) IVPB 500 mg  Status:  Discontinued     500 mg 100 mL/hr over 60 Minutes Intravenous Every 8 hours 07/26/13 0259 07/27/13 1152   07/26/13 0115  vancomycin (VANCOCIN) IVPB 1000 mg/200 mL premix     1,000 mg 200 mL/hr over 60 Minutes Intravenous  Once 07/26/13 0110 07/26/13 0218   07/26/13 0115  piperacillin-tazobactam (ZOSYN) IVPB 3.375 g     3.375 g 100 mL/hr over 30 Minutes Intravenous  Once 07/26/13 0110 07/26/13 0145        HPI/Subjective: Patient is non verbal.  Family - Kim -  understands her mother's death is imminent.  Objective: Filed Vitals:   07/27/13 2031 07/28/13 0524 07/28/13 1047 07/28/13 1210  BP: 108/70 100/62    Pulse: 74 63    Temp: 98.9 F (37.2 C) 99.3 F (37.4 C) 101.6 F (38.7 C) 97.3 F (36.3 C)  TempSrc: Axillary Axillary Axillary Axillary  Resp: 20 22    Height:      Weight:      SpO2: 100% 96%      Intake/Output Summary (Last 24 hours) at 07/28/13 1441 Last data filed at 07/27/13 2033  Gross per 24 hour  Intake      0 ml  Output   1000 ml  Net  -  1000 ml   Filed Weights   07/26/13 0415  Weight: 45.3 kg (99 lb 13.9 oz)    Exam: General: elderly frail, does not awaken to my voice. In fetal position. HEENT:  [place]/AT Neck: Supple, no JVD, no masses  Cardiovascular: RRR, S1 S2 auscultated, no rubs, murmurs or gallops.   Respiratory: Clear to auscultation bilaterally.  Breaths are infrequent. Abdomen: Soft, nontender, nondistended Skin: Without rashes exudates or nodules.     Data Reviewed: Basic Metabolic Panel:  Recent Labs Lab 07/26/13 0055 07/26/13 0254 07/26/13 0535  NA 141  --  139  K 4.1  --  3.9  CL 107  --  104  CO2 19  --  18*  GLUCOSE 166*  --  198*  BUN 22  --  24*  CREATININE 1.21*  --  1.13*  CALCIUM 7.4*  --  7.4*  MG  --  1.7  --    Liver  Function Tests:  Recent Labs Lab 07/26/13 0055 07/26/13 0535  AST 127* 1601*  ALT 111* 1254*  ALKPHOS 54 95  BILITOT 0.2* 0.5  PROT 4.2* 5.1*  ALBUMIN 2.1* 2.4*  CBC:  Recent Labs Lab 07/26/13 0055 07/26/13 0535  WBC 4.6 13.1*  NEUTROABS 3.7 11.4*  HGB 9.1* 12.5  HCT 26.7* 37.0  MCV 89.0 89.6  PLT 153 161   CBG:  Recent Labs Lab 07/26/13 0420  GLUCAP 175*    Recent Results (from the past 240 hour(s))  CLOSTRIDIUM DIFFICILE BY PCR     Status: None   Collection Time    07/26/13 12:46 AM      Result Value Ref Range Status   C difficile by pcr NEGATIVE  NEGATIVE Final  CULTURE, BLOOD (ROUTINE X 2)     Status: None   Collection Time    07/26/13 12:55 AM      Result Value Ref Range Status   Specimen Description BLOOD LEFT HAND   Final   Special Requests BOTTLES DRAWN AEROBIC AND ANAEROBIC 5CC EA   Final   Culture  Setup Time     Final   Value: 07/26/2013 13:41     Performed at [facility]   Culture     Final   Value:        BLOOD CULTURE RECEIVED NO GROWTH TO DATE CULTURE WILL BE HELD FOR 5 DAYS BEFORE ISSUING A FINAL NEGATIVE REPORT     Performed at [facility]   Report Status PENDING   Incomplete  CULTURE, BLOOD (ROUTINE X 2)     Status: None   Collection Time    07/26/13  1:00 AM      Result Value Ref Range Status   Specimen Description BLOOD RIGHT FOREARM   Final   Special Requests     Final   Value: BOTTLES DRAWN AEROBIC AND ANAEROBIC 7CC BLUE 8CC RED   Culture  Setup Time     Final   Value: 07/26/2013 13:42     Performed at [facility]   Culture     Final   Value:        BLOOD CULTURE RECEIVED NO GROWTH TO DATE CULTURE WILL BE HELD FOR 5 DAYS BEFORE ISSUING A FINAL NEGATIVE REPORT     Performed at [facility]   Report Status PENDING   Incomplete  URINE CULTURE     Status: None   Collection Time    07/26/13  3:08 AM      Result  Value Ref Range Status   Specimen Description URINE, RANDOM   Final   Special  Requests NONE   Final   Culture  Setup Time     Final   Value: 07/26/2013 14:47     Performed at [facility]   Colony Count     Final   Value: NO GROWTH     Performed at [facility]   Culture     Final   Value: NO GROWTH     Performed at [facility]   Report Status 07/27/2013 FINAL   Final  MRSA PCR SCREENING     Status: None   Collection Time    07/26/13  4:18 AM      Result Value Ref Range Status   MRSA by PCR NEGATIVE  NEGATIVE Final   Comment:            The GeneXpert MRSA Assay (FDA     approved for NASAL specimens     only), is one component of a     comprehensive MRSA colonization     surveillance program. It is not     intended to diagnose MRSA     infection nor to guide or     monitor treatment for     MRSA infections.     Studies: No results found.  Scheduled Meds: . acetaminophen  650 mg Rectal Once  . levothyroxine  12.5 mcg Intravenous Daily  . LORazepam  0.5 mg Intravenous 6 times per day   Continuous Infusions: . sodium chloride 10 mL/hr at 07/27/13 1600  . morphine      Principal Problem:   Hospice care patient Active Problems:   Dementia   Severe sepsis with septic shock   DNR (do not resuscitate)    Conley Canal  [facility] Pager [phone]. If 7PM-7AM, please contact night-coverage at [email or web address], password [facility] 07/28/2013, 2:41 PM  LOS: 2 days

## 2013-07-28 NOTE — Progress Notes (Signed)
Addendum  Patient seen and examined, chart and data base reviewed.  I agree with the above assessment and plan.  For full details please see Mrs. Algis DownsMarianne York PA note.  Septic shock, UTI and severe dementia. Patient is for full comfort.   Clint LippsMutaz A Garyson Stelly, MD [facility] Pager: [phone] 07/28/2013, 3:27 PM

## 2013-07-28 NOTE — Care Management Note (Signed)
    Page 1 of 1   07/28/2013     3:07:34 PM   CARE MANAGEMENT NOTE 07/28/2013  [facility]  [facility]   Account Number:  [id number]  Date Initiated:  07/28/2013  Documentation initiated by:  Letha CapeAYLOR,Naheim Burgen  Subjective/Objective Assessment:   dx severe sepsis with septic shock  admit- lives with daughters.     Action/Plan:   Anticipated DC Date:  07/28/2013   Anticipated DC Plan:  HOSPICE MEDICAL FACILITY      DC Planning Services  CM consult      PAC Choice  HOSPICE   Choice offered to / List presented to:  C-1 Patient           Status of service:  Completed, signed off Medicare Important Message given?   (If response is "NO", the following Medicare IM given date fields will be blank) Date Medicare IM given:   Date Additional Medicare IM given:    Discharge Disposition:  HOSPICE MEDICAL FACILITY  Per UR Regulation:  Reviewed for med. necessity/level of care/duration of stay  If discussed at Long Length of Stay Meetings, dates discussed:    Comments:  07/28/13 1505 Letha Capeeborah Mohanad Carsten RN, BSN [phone] [facility], patient is activiely dying.

## 2013-07-28 NOTE — Progress Notes (Signed)
Inpatient [facility] Rm 5W25 P. [facility] RN Visit- M. Konrad DoloresLester, RN  [facility] of Alzheimers; DNR code status  Pt [facility] Clydie BraunKaren, RN, pt non-verbal, does not open eyes to voice or touch; side-lying fetal position, continues to spike fever very warm to touch; requiring increased bolus dosing of PRN Morphine for altered respirations, s/sx pain discomfort overnight; continues on scheduled Ativan - frequent monitoring for non-verbal s/sx of discomfort necessary; pt discussed with staff RN Roxan HockeyShinita who will follow up for any medication needs. Dtr Cala BradfordKimberly and friend StonewallHolly at bedside; Cala BradfordKimberly relayed pt appears more comfortable after receiving bolus doses although on this visit pt continues with intermittent twitching and drawing knees up to chest with touch; Cala BradfordKimberly voiced she realizes her mother is making changes and time is very short and has been comforted by friends and their pastor; she is playing gospel music at bedside for her mom. Cala BradfordKimberly voiced she anticipates her mother will pass soon after discussion with hospital staff however acknowledges 'these things are not always in our time'. Emotional support offered. [facility] continues to follow please contact [phone] with any hospice needs/concerns. Valente DavidMargie Misao Fackrell, [facility] Hospital Liaison [phone]

## 2013-07-29 NOTE — Progress Notes (Signed)
PROGRESS NOTE  Krista [name] ZOX:[id number] DOB: 12/26/1940 DOA: 07/26/2013 PCP: Eustaquio BoydenJavier Gutierrez, MD  73 yo female with PMH of severe dementia (non-verbal), CVA, and distant c-diff infection,  admitted from SNF to ICU after coughing up blood, seizing and developing hypotension that did not respond to fluid resuscitation.  On admission her temp was 105, and she was in multiorgan failure with septic shock.  Assessment/Plan:  Septic shock Resolved.  Patient is now full comfort care. Presumably from UTI On pressors in ICU Large volume watery stool in ER.  C-dff negative. Critical Care doctors and family determined that patient is not a candidate for further aggressive care. Hospice is on board and she is full comfort care. Low volume (1 mg/hour) morphine drip and ativan for comfort. Patient has a very poor prognosis and will likely pass in hours.  Severe Dementia Patient is bed ridden and non verbal. Cachectic and lying contracted in fetal position. Family is at bedside.  UTI Antibiotics have been discontinued.  Hx of CVA  Severe mal nutrition   CKD     DVT Prophylaxis:  N/a  Full comfort care  Code Status: DNR Family Communication: daughter Selena BattenKim at bedside. Disposition Plan: very poor prognosis.  Patient is being followed by Hospice.   Consultants:  Hospice / Palliative Medicine.   Antibiotics: Anti-infectives   Start     Dose/Rate Route Frequency Ordered Stop   07/28/13 0600  vancomycin (VANCOCIN) 500 mg in sodium chloride 0.9 % 100 mL IVPB  Status:  Discontinued     500 mg 100 mL/hr over 60 Minutes Intravenous Every 48 hours 07/26/13 0700 07/26/13 0828   07/26/13 1000  piperacillin-tazobactam (ZOSYN) IVPB 3.375 g  Status:  Discontinued     3.375 g 12.5 mL/hr over 240 Minutes Intravenous Every 8 hours 07/26/13 0324 07/26/13 0828   07/26/13 0600  piperacillin-tazobactam (ZOSYN) IVPB 2.25 g  Status:  Discontinued     2.25 g 100 mL/hr over 30 Minutes Intravenous  4 times per day 07/26/13 0259 07/26/13 0323   07/26/13 0600  vancomycin (VANCOCIN) 50 mg/mL oral solution 125 mg  Status:  Discontinued     125 mg Oral 4 times per day 07/26/13 0259 07/27/13 1152   07/26/13 0300  metroNIDAZOLE (FLAGYL) IVPB 500 mg  Status:  Discontinued     500 mg 100 mL/hr over 60 Minutes Intravenous Every 8 hours 07/26/13 0259 07/27/13 1152   07/26/13 0115  vancomycin (VANCOCIN) IVPB 1000 mg/200 mL premix     1,000 mg 200 mL/hr over 60 Minutes Intravenous  Once 07/26/13 0110 07/26/13 0218   07/26/13 0115  piperacillin-tazobactam (ZOSYN) IVPB 3.375 g     3.375 g 100 mL/hr over 30 Minutes Intravenous  Once 07/26/13 0110 07/26/13 0145        HPI/Subjective: Patient is non verbal.  Family - Kim -  understands her mother's death is imminent and appreciates our support.  Objective: Filed Vitals:   07/28/13 2124 07/28/13 2353 07/29/13 0511 07/29/13 1042  BP: 95/59  111/65 127/67  Pulse: 74  79 69  Temp: 99.4 F (37.4 C) 98.7 F (37.1 C) 98.2 F (36.8 C) 98.2 F (36.8 C)  TempSrc: Axillary Axillary Axillary Axillary  Resp: 18  18 18   Height:      Weight:      SpO2: 89%  90% 91%    Intake/Output Summary (Last 24 hours) at 07/29/13 1448 Last data filed at 07/29/13 1021  Gross per 24 hour  Intake  0 ml  Output    785 ml  Net   -785 ml   Filed Weights   07/26/13 0415  Weight: 45.3 kg (99 lb 13.9 oz)    Exam: General: elderly frail, does not awaken to my voice. Contracted and in fetal position. HEENT:  [place]/AT Neck: Supple, no JVD, no masses  Cardiovascular: RRR, S1 S2 auscultated, no rubs, murmurs or gallops.   Respiratory: Clear to auscultation bilaterally.  Breaths are infrequent. Abdomen: cachectic, Soft, nontender, nondistended Skin: Without rashes exudates or nodules.     Data Reviewed: Basic Metabolic Panel:  Recent Labs Lab 07/26/13 0055 07/26/13 0254 07/26/13 0535  NA 141  --  139  K 4.1  --  3.9  CL 107  --  104  CO2 19  --  18*   GLUCOSE 166*  --  198*  BUN 22  --  24*  CREATININE 1.21*  --  1.13*  CALCIUM 7.4*  --  7.4*  MG  --  1.7  --    Liver Function Tests:  Recent Labs Lab 07/26/13 0055 07/26/13 0535  AST 127* 1601*  ALT 111* 1254*  ALKPHOS 54 95  BILITOT 0.2* 0.5  PROT 4.2* 5.1*  ALBUMIN 2.1* 2.4*  CBC:  Recent Labs Lab 07/26/13 0055 07/26/13 0535  WBC 4.6 13.1*  NEUTROABS 3.7 11.4*  HGB 9.1* 12.5  HCT 26.7* 37.0  MCV 89.0 89.6  PLT 153 161   CBG:  Recent Labs Lab 07/26/13 0420  GLUCAP 175*    Recent Results (from the past 240 hour(s))  CLOSTRIDIUM DIFFICILE BY PCR     Status: None   Collection Time    07/26/13 12:46 AM      Result Value Ref Range Status   C difficile by pcr NEGATIVE  NEGATIVE Final  CULTURE, BLOOD (ROUTINE X 2)     Status: None   Collection Time    07/26/13 12:55 AM      Result Value Ref Range Status   Specimen Description BLOOD LEFT HAND   Final   Special Requests BOTTLES DRAWN AEROBIC AND ANAEROBIC 5CC EA   Final   Culture  Setup Time     Final   Value: 07/26/2013 13:41     Performed at [facility]   Culture     Final   Value:        BLOOD CULTURE RECEIVED NO GROWTH TO DATE CULTURE WILL BE HELD FOR 5 DAYS BEFORE ISSUING A FINAL NEGATIVE REPORT     Performed at [facility]   Report Status PENDING   Incomplete  CULTURE, BLOOD (ROUTINE X 2)     Status: None   Collection Time    07/26/13  1:00 AM      Result Value Ref Range Status   Specimen Description BLOOD RIGHT FOREARM   Final   Special Requests     Final   Value: BOTTLES DRAWN AEROBIC AND ANAEROBIC 7CC BLUE 8CC RED   Culture  Setup Time     Final   Value: 07/26/2013 13:42     Performed at [facility]   Culture     Final   Value:        BLOOD CULTURE RECEIVED NO GROWTH TO DATE CULTURE WILL BE HELD FOR 5 DAYS BEFORE ISSUING A FINAL NEGATIVE REPORT     Performed at [facility]   Report Status PENDING   Incomplete  URINE CULTURE     Status: None  Collection  Time    07/26/13  3:08 AM      Result Value Ref Range Status   Specimen Description URINE, RANDOM   Final   Special Requests NONE   Final   Culture  Setup Time     Final   Value: 07/26/2013 14:47     Performed at [facility]   Colony Count     Final   Value: NO GROWTH     Performed at [facility]   Culture     Final   Value: NO GROWTH     Performed at [facility]   Report Status 07/27/2013 FINAL   Final  MRSA PCR SCREENING     Status: None   Collection Time    07/26/13  4:18 AM      Result Value Ref Range Status   MRSA by PCR NEGATIVE  NEGATIVE Final   Comment:            The GeneXpert MRSA Assay (FDA     approved for NASAL specimens     only), is one component of a     comprehensive MRSA colonization     surveillance program. It is not     intended to diagnose MRSA     infection nor to guide or     monitor treatment for     MRSA infections.     Studies: No results found.  Scheduled Meds: . acetaminophen  650 mg Rectal Once  . LORazepam  0.5 mg Intravenous 6 times per day   Continuous Infusions: . morphine 1 mg/hr (07/29/13 0900)    Principal Problem:   Hospice care patient Active Problems:   Dementia   Severe sepsis with septic shock   DNR (do not resuscitate)    Conley Canal  [facility] Pager [phone]. If 7PM-7AM, please contact night-coverage at [email or web address], password [facility] 07/29/2013, 2:48 PM  LOS: 3 days    Attending Seen and examined, agree with the above assessment and plan.  Windell Norfolk MD

## 2013-07-29 NOTE — Progress Notes (Signed)
Inpatient [facility] Rm 5W25 [facility] Visit- M. Konrad DoloresLester, RN  [facility]; DNR code status  Pt seen at bedside, RR= 16 with intermittent 5 sec pauses noted; temperature changes noted in extremities and some discoloration around lips; these EOL s/sx of decline were discussed with Cala BradfordKimberly who voiced she is aware these changes can be expected as pt gets closer to EOL; observed slight tremors with attempts to move extremities which are rigid, contracted; - per chart review and discussion with dtr, Cala BradfordKimberly at bedside pt required multiple bolus doses of pain medication yesterday for symptom management; continuous Morphine drip @ 0.5 mg/hr initiated, with need to be increased to 1 mg/hr this morning  Cala BradfordKimberly stated she plans to go home later to get some rest and her sister, Bjorn LoserRhonda will be at bedside - emotional support offered, Cala BradfordKimberly voiced they are anticipating pt's passing and appreciate all the care the staff has provided; at this time, pt continues to require frequent assessment and adjustment of medications for comfort; [facility] continues to follow please contact [phone] with any hospice needs/concerns Valente DavidMargie Rima Blizzard, RN [phone]

## 2013-07-30 NOTE — Social Work (Signed)
[facility] SW, Jilda PandaVanessa Royse called pt's daughters Bjorn LoserRhonda and Cala BradfordKimberly to check in and provide emotional support. Bjorn LoserRhonda and Cala BradfordKimberly stated that pt seems to be "holding on." Their faith continues to be a strong source of support for them. They report that their brother continues to have difficulty accepting pt's decline and impending death, and has only been to visit twice at the hospital for a few minutes each time. SW reminded them about SW support and [facility]ave[facility] and encouraged them to inform Arlys JohnBrian about these resources. SW provided emotional support to Nepalhonda and Kimberly, who expressed appreciation. Bjorn Loserhonda completed funeral arrangements with [employer] and [facility]. Family stated that [facility] if pt stabilizes, and if bed is available.  Jilda PandaVanessa Royse, LCSWA

## 2013-07-30 NOTE — Progress Notes (Signed)
Inpatient [facility] 5[facility]25 [facility] [place] [facility] Visit- M. Konrad DoloresLester, RN  [facility]; DNR code status  Pt seen at bedside, pale, unresponsive to voice or light touch, RR= 8-10 with intermittent 5 sec pauses noted;  continuous Morphine drip at 1 mg/hr; additional bolus dose yesterday afternoon required for discomfort; continues to require on going assessment for non-verbal s/sx of discomfort. No family present at this visit; pt status discussed with staff RN who informed dtr had recently left with plan to return in afternoon. [facility] continues to follow please contact [phone] with any hospice needs/concerns Valente DavidMargie Keyatta Tolles, [facility] Hospital Liaison [phone]

## 2013-07-30 NOTE — Progress Notes (Signed)
PATIENT DETAILS Name: Krista [name] Age: 73 y.o. Sex: female Date of Birth: [birth year] Admit Date: 07/26/2013 Admitting Physician Levert Feinstein, MD ZOX:[id number] Sharen Hones, MD  Subjective: [place] Setting  Assessment/Plan: Septic shock Severe dementia UTI Chronic kidney disease History of CVA Severe protein calorie malnutrition  Currently; continue with full overt measures, continue IV morphine. Expect inpatient depth    Disposition: Remain inpatient  DVT Prophylaxis: Not needed-comfort measures  Code Status: DNR  Family Communication Daughter at bedside yesterday  Procedures:  None  CONSULTS:  pulmonary/intensive care  Time spent 40 minutes-which includes 50% of the time with face-to-face with patient/ family and coordinating care related to the above assessment and plan.    MEDICATIONS: Scheduled Meds: . acetaminophen  650 mg Rectal Once  . LORazepam  0.5 mg Intravenous 6 times per day   Continuous Infusions: . morphine 1 mg/hr (07/29/13 0900)   PRN Meds:.acetaminophen, morphine injection  Antibiotics: Anti-infectives   Start     Dose/Rate Route Frequency Ordered Stop   07/28/13 0600  vancomycin (VANCOCIN) 500 mg in sodium chloride 0.9 % 100 mL IVPB  Status:  Discontinued     500 mg 100 mL/hr over 60 Minutes Intravenous Every 48 hours 07/26/13 0700 07/26/13 0828   07/26/13 1000  piperacillin-tazobactam (ZOSYN) IVPB 3.375 g  Status:  Discontinued     3.375 g 12.5 mL/hr over 240 Minutes Intravenous Every 8 hours 07/26/13 0324 07/26/13 0828   07/26/13 0600  piperacillin-tazobactam (ZOSYN) IVPB 2.25 g  Status:  Discontinued     2.25 g 100 mL/hr over 30 Minutes Intravenous 4 times per day 07/26/13 0259 07/26/13 0323   07/26/13 0600  vancomycin (VANCOCIN) 50 mg/mL oral solution 125 mg  Status:  Discontinued     125 mg Oral 4 times per day 07/26/13 0259 07/27/13 1152   07/26/13 0300  metroNIDAZOLE (FLAGYL) IVPB 500 mg  Status:  Discontinued     500  mg 100 mL/hr over 60 Minutes Intravenous Every 8 hours 07/26/13 0259 07/27/13 1152   07/26/13 0115  vancomycin (VANCOCIN) IVPB 1000 mg/200 mL premix     1,000 mg 200 mL/hr over 60 Minutes Intravenous  Once 07/26/13 0110 07/26/13 0218   07/26/13 0115  piperacillin-tazobactam (ZOSYN) IVPB 3.375 g     3.375 g 100 mL/hr over 30 Minutes Intravenous  Once 07/26/13 0110 07/26/13 0145       PHYSICAL EXAM: Vital signs in last 24 hours: Filed Vitals:   07/29/13 1042 07/29/13 2039 07/29/13 2116 07/30/13 0432  BP: 127/67  113/69 115/70  Pulse: 69  88 84  Temp: 98.2 F (36.8 C) 99.4 F (37.4 C) 98.1 F (36.7 C) 98.1 F (36.7 C)  TempSrc: Axillary  Axillary Oral  Resp: 18  18 18   Height:      Weight:      SpO2: 91%  90% 90%    Weight change:  Filed Weights   07/26/13 0415  Weight: 45.3 kg (99 lb 13.9 oz)   Body mass index is 16.62 kg/(m^2).   Gen Exam: Unresponsive  Neck: Supple, No JVD.   Chest: B/L Clear.   CVS: S1 S2 Regular, no murmurs.  Abdomen: soft, BS +, non tender, non distended.  Extremities: no edema, lower extremities warm to touch. Skin: No Rash.  Wounds: N/A.    Intake/Output from previous day:  Intake/Output Summary (Last 24 hours) at 07/30/13 1544 Last data filed at 07/30/13 1138  Gross per 24 hour  Intake  4 ml  Output    550 ml  Net   -546 ml     LAB RESULTS: CBC  Recent Labs Lab 07/26/13 0055 07/26/13 0535  WBC 4.6 13.1*  HGB 9.1* 12.5  HCT 26.7* 37.0  PLT 153 161  MCV 89.0 89.6  MCH 30.3 30.3  MCHC 34.1 33.8  RDW 14.4 14.6  LYMPHSABS 0.7 1.2  MONOABS 0.2 0.5  EOSABS 0.0 0.0  BASOSABS 0.0 0.0    Chemistries   Recent Labs Lab 07/26/13 0055 07/26/13 0254 07/26/13 0535  NA 141  --  139  K 4.1  --  3.9  CL 107  --  104  CO2 19  --  18*  GLUCOSE 166*  --  198*  BUN 22  --  24*  CREATININE 1.21*  --  1.13*  CALCIUM 7.4*  --  7.4*  MG  --  1.7  --     CBG:  Recent Labs Lab 07/26/13 0420  GLUCAP 175*     GFR Estimated Creatinine Clearance: 31.7 ml/min (by C-G formula based on Cr of 1.13).  Coagulation profile No results found for this basename: INR, PROTIME,  in the last 168 hours  Cardiac Enzymes No results found for this basename: CK, CKMB, TROPONINI, MYOGLOBIN,  in the last 168 hours  No components found with this basename: POCBNP,  No results found for this basename: DDIMER,  in the last 72 hours No results found for this basename: HGBA1C,  in the last 72 hours No results found for this basename: CHOL, HDL, LDLCALC, TRIG, CHOLHDL, LDLDIRECT,  in the last 72 hours No results found for this basename: TSH, T4TOTAL, FREET3, T3FREE, THYROIDAB,  in the last 72 hours No results found for this basename: VITAMINB12, FOLATE, FERRITIN, TIBC, IRON, RETICCTPCT,  in the last 72 hours No results found for this basename: LIPASE, AMYLASE,  in the last 72 hours  Urine Studies No results found for this basename: UACOL, UAPR, USPG, UPH, UTP, UGL, UKET, UBIL, UHGB, UNIT, UROB, ULEU, UEPI, UWBC, URBC, UBAC, CAST, CRYS, UCOM, BILUA,  in the last 72 hours  MICROBIOLOGY: Recent Results (from the past 240 hour(s))  CLOSTRIDIUM DIFFICILE BY PCR     Status: None   Collection Time    07/26/13 12:46 AM      Result Value Ref Range Status   C difficile by pcr NEGATIVE  NEGATIVE Final  CULTURE, BLOOD (ROUTINE X 2)     Status: None   Collection Time    07/26/13 12:55 AM      Result Value Ref Range Status   Specimen Description BLOOD LEFT HAND   Final   Special Requests BOTTLES DRAWN AEROBIC AND ANAEROBIC 5CC EA   Final   Culture  Setup Time     Final   Value: 07/26/2013 13:41     Performed at [facility]   Culture     Final   Value:        BLOOD CULTURE RECEIVED NO GROWTH TO DATE CULTURE WILL BE HELD FOR 5 DAYS BEFORE ISSUING A FINAL NEGATIVE REPORT     Performed at [facility]   Report Status PENDING   Incomplete  CULTURE, BLOOD (ROUTINE X 2)     Status: None   Collection Time     07/26/13  1:00 AM      Result Value Ref Range Status   Specimen Description BLOOD RIGHT FOREARM   Final   Special Requests  Final   Value: BOTTLES DRAWN AEROBIC AND ANAEROBIC 7CC BLUE 8CC RED   Culture  Setup Time     Final   Value: 07/26/2013 13:42     Performed at [facility]   Culture     Final   Value:        BLOOD CULTURE RECEIVED NO GROWTH TO DATE CULTURE WILL BE HELD FOR 5 DAYS BEFORE ISSUING A FINAL NEGATIVE REPORT     Performed at [facility]   Report Status PENDING   Incomplete  URINE CULTURE     Status: None   Collection Time    07/26/13  3:08 AM      Result Value Ref Range Status   Specimen Description URINE, RANDOM   Final   Special Requests NONE   Final   Culture  Setup Time     Final   Value: 07/26/2013 14:47     Performed at [facility] Count     Final   Value: NO GROWTH     Performed at [facility]   Culture     Final   Value: NO GROWTH     Performed at [facility]   Report Status 07/27/2013 FINAL   Final  MRSA PCR SCREENING     Status: None   Collection Time    07/26/13  4:18 AM      Result Value Ref Range Status   MRSA by PCR NEGATIVE  NEGATIVE Final   Comment:            The GeneXpert MRSA Assay (FDA     approved for NASAL specimens     only), is one component of a     comprehensive MRSA colonization     surveillance program. It is not     intended to diagnose MRSA     infection nor to guide or     monitor treatment for     MRSA infections.    RADIOLOGY STUDIES/RESULTS: Ct Head Wo Contrast  07/26/2013   CLINICAL DATA:  Hypotensive, could sepsis  EXAM: CT HEAD WITHOUT CONTRAST  TECHNIQUE: Contiguous axial images were obtained from the base of the skull through the vertex without intravenous contrast.  COMPARISON:  Prior CT from 06/09/2013  FINDINGS: Advanced age-related atrophy with chronic microvascular ischemic changes noted. Prominent vascular calcifications noted within the carotid siphons  bilaterally.  There is no acute intracranial hemorrhage or infarct. No mass lesion or midline shift. Gray-white matter differentiation is well maintained. Ventricles are normal in size without evidence of hydrocephalus. CSF containing spaces are within normal limits. No extra-axial fluid collection.  The calvarium is intact.  Orbital soft tissues are within normal limits.  The paranasal sinuses and mastoid air cells are well pneumatized and free of fluid.  Scalp soft tissues are unremarkable.  IMPRESSION: 1. No acute intracranial process. 2. Advanced atrophy with chronic microvascular ischemic disease, unchanged.   Electronically Signed   By: Rise Mu M.D.   On: 07/26/2013 02:32   Dg Chest Port 1 View  (if Code Sepsis Called)  07/26/2013   CLINICAL DATA:  Hypotension.  EXAM: PORTABLE CHEST - 1 VIEW  COMPARISON:  DG CHEST 2 VIEW dated 05/15/2013; DG CHEST 1V PORT dated 10/09/2012; DG CHEST 2 VIEW dated 07/13/2008; DG CHEST 2 VIEW dated 02/06/2005  FINDINGS: Cardiac silhouette normal in size for AP portable technique. Thoracic aorta atherosclerotic, unchanged. Lungs clear. Bronchovascular markings normal. Pulmonary vascularity normal. No visible  pleural effusions. No pneumothorax.  IMPRESSION: No acute cardiopulmonary disease.   Electronically Signed   By: Hulan Saashomas  Lawrence M.D.   On: 07/26/2013 01:40    Jeoffrey MassedGHIMIRE,Zikeria Keough, MD  [facility] Pager:[phone]  If 7PM-7AM, please contact night-coverage [email or web address] Password [email or web address] 07/30/2013, 3:44 PM   LOS: 4 days

## 2013-07-31 NOTE — Progress Notes (Signed)
PATIENT DETAILS Name: Krista [name] Age: 73 y.o. Sex: female Date of Birth: 01/18/1941 Admit Date: 07/26/2013 Admitting Physician Levert Feinstein, MD ZOX:[id number] Sharen Hones, MD  Subjective: Barely awakens-daughter at bedside  Assessment/Plan: Septic shock Severe dementia UTI Chronic kidney disease History of CVA Severe protein calorie malnutrition  Currently; continue with full overt measures, continue IV morphine. Expect inpatient depth  Disposition: Remain inpatient  DVT Prophylaxis: Not needed-comfort measures  Code Status: DNR  Family Communication Daughter at bedside   Procedures:  None  CONSULTS:  pulmonary/intensive care  MEDICATIONS: Scheduled Meds: . acetaminophen  650 mg Rectal Once  . LORazepam  0.5 mg Intravenous 6 times per day   Continuous Infusions: . morphine 1 mg/hr (07/29/13 0900)   PRN Meds:.acetaminophen, morphine injection  Antibiotics: Anti-infectives   Start     Dose/Rate Route Frequency Ordered Stop   07/28/13 0600  vancomycin (VANCOCIN) 500 mg in sodium chloride 0.9 % 100 mL IVPB  Status:  Discontinued     500 mg 100 mL/hr over 60 Minutes Intravenous Every 48 hours 07/26/13 0700 07/26/13 0828   07/26/13 1000  piperacillin-tazobactam (ZOSYN) IVPB 3.375 g  Status:  Discontinued     3.375 g 12.5 mL/hr over 240 Minutes Intravenous Every 8 hours 07/26/13 0324 07/26/13 0828   07/26/13 0600  piperacillin-tazobactam (ZOSYN) IVPB 2.25 g  Status:  Discontinued     2.25 g 100 mL/hr over 30 Minutes Intravenous 4 times per day 07/26/13 0259 07/26/13 0323   07/26/13 0600  vancomycin (VANCOCIN) 50 mg/mL oral solution 125 mg  Status:  Discontinued     125 mg Oral 4 times per day 07/26/13 0259 07/27/13 1152   07/26/13 0300  metroNIDAZOLE (FLAGYL) IVPB 500 mg  Status:  Discontinued     500 mg 100 mL/hr over 60 Minutes Intravenous Every 8 hours 07/26/13 0259 07/27/13 1152   07/26/13 0115  vancomycin (VANCOCIN) IVPB 1000 mg/200 mL premix     1,000 mg 200 mL/hr over 60 Minutes Intravenous  Once 07/26/13 0110 07/26/13 0218   07/26/13 0115  piperacillin-tazobactam (ZOSYN) IVPB 3.375 g     3.375 g 100 mL/hr over 30 Minutes Intravenous  Once 07/26/13 0110 07/26/13 0145       PHYSICAL EXAM: Vital signs in last 24 hours: Filed Vitals:   07/30/13 0432 07/30/13 1300 07/30/13 2226 07/31/13 0521  BP: 115/70 130/77 117/67 120/71  Pulse: 84 78 70 71  Temp: 98.1 F (36.7 C) 98.5 F (36.9 C) 98.1 F (36.7 C) 98 F (36.7 C)  TempSrc: Oral Axillary Axillary Axillary  Resp: 18 18 18 18   Height:      Weight:      SpO2: 90% 93% 96% 92%    Weight change:  Filed Weights   07/26/13 0415  Weight: 45.3 kg (99 lb 13.9 oz)   Body mass index is 16.62 kg/(m^2).   Gen Exam: Unresponsive  Neck: Supple, No JVD.   Chest: B/L Clear.   CVS: S1 S2 Regular, no murmurs.  Abdomen: soft, BS +, non tender, non distended.  Extremities: no edema, lower extremities warm to touch. Skin: No Rash.  Wounds: N/A.    Intake/Output from previous day:  Intake/Output Summary (Last 24 hours) at 07/31/13 1257 Last data filed at 07/31/13 0522  Gross per 24 hour  Intake     23 ml  Output    250 ml  Net   -227 ml     LAB RESULTS: CBC  Recent  Labs Lab 07/26/13 0055 07/26/13 0535  WBC 4.6 13.1*  HGB 9.1* 12.5  HCT 26.7* 37.0  PLT 153 161  MCV 89.0 89.6  MCH 30.3 30.3  MCHC 34.1 33.8  RDW 14.4 14.6  LYMPHSABS 0.7 1.2  MONOABS 0.2 0.5  EOSABS 0.0 0.0  BASOSABS 0.0 0.0    Chemistries   Recent Labs Lab 07/26/13 0055 07/26/13 0254 07/26/13 0535  NA 141  --  139  K 4.1  --  3.9  CL 107  --  104  CO2 19  --  18*  GLUCOSE 166*  --  198*  BUN 22  --  24*  CREATININE 1.21*  --  1.13*  CALCIUM 7.4*  --  7.4*  MG  --  1.7  --     CBG:  Recent Labs Lab 07/26/13 0420  GLUCAP 175*    GFR Estimated Creatinine Clearance: 31.7 ml/min (by C-G formula based on Cr of 1.13).  Coagulation profile No results found for this basename:  INR, PROTIME,  in the last 168 hours  Cardiac Enzymes No results found for this basename: CK, CKMB, TROPONINI, MYOGLOBIN,  in the last 168 hours  No components found with this basename: POCBNP,  No results found for this basename: DDIMER,  in the last 72 hours No results found for this basename: HGBA1C,  in the last 72 hours No results found for this basename: CHOL, HDL, LDLCALC, TRIG, CHOLHDL, LDLDIRECT,  in the last 72 hours No results found for this basename: TSH, T4TOTAL, FREET3, T3FREE, THYROIDAB,  in the last 72 hours No results found for this basename: VITAMINB12, FOLATE, FERRITIN, TIBC, IRON, RETICCTPCT,  in the last 72 hours No results found for this basename: LIPASE, AMYLASE,  in the last 72 hours  Urine Studies No results found for this basename: UACOL, UAPR, USPG, UPH, UTP, UGL, UKET, UBIL, UHGB, UNIT, UROB, ULEU, UEPI, UWBC, URBC, UBAC, CAST, CRYS, UCOM, BILUA,  in the last 72 hours  MICROBIOLOGY: Recent Results (from the past 240 hour(s))  CLOSTRIDIUM DIFFICILE BY PCR     Status: None   Collection Time    07/26/13 12:46 AM      Result Value Ref Range Status   C difficile by pcr NEGATIVE  NEGATIVE Final  CULTURE, BLOOD (ROUTINE X 2)     Status: None   Collection Time    07/26/13 12:55 AM      Result Value Ref Range Status   Specimen Description BLOOD LEFT HAND   Final   Special Requests BOTTLES DRAWN AEROBIC AND ANAEROBIC 5CC EA   Final   Culture  Setup Time     Final   Value: 07/26/2013 13:41     Performed at [facility]   Culture     Final   Value:        BLOOD CULTURE RECEIVED NO GROWTH TO DATE CULTURE WILL BE HELD FOR 5 DAYS BEFORE ISSUING A FINAL NEGATIVE REPORT     Performed at [facility]   Report Status PENDING   Incomplete  CULTURE, BLOOD (ROUTINE X 2)     Status: None   Collection Time    07/26/13  1:00 AM      Result Value Ref Range Status   Specimen Description BLOOD RIGHT FOREARM   Final   Special Requests     Final   Value:  BOTTLES DRAWN AEROBIC AND ANAEROBIC 7CC BLUE 8CC RED   Culture  Setup Time     Final  Value: 07/26/2013 13:42     Performed at [facility]   Culture     Final   Value:        BLOOD CULTURE RECEIVED NO GROWTH TO DATE CULTURE WILL BE HELD FOR 5 DAYS BEFORE ISSUING A FINAL NEGATIVE REPORT     Performed at [facility]   Report Status PENDING   Incomplete  URINE CULTURE     Status: None   Collection Time    07/26/13  3:08 AM      Result Value Ref Range Status   Specimen Description URINE, RANDOM   Final   Special Requests NONE   Final   Culture  Setup Time     Final   Value: 07/26/2013 14:47     Performed at [facility] Count     Final   Value: NO GROWTH     Performed at [facility]   Culture     Final   Value: NO GROWTH     Performed at [facility]   Report Status 07/27/2013 FINAL   Final  MRSA PCR SCREENING     Status: None   Collection Time    07/26/13  4:18 AM      Result Value Ref Range Status   MRSA by PCR NEGATIVE  NEGATIVE Final   Comment:            The GeneXpert MRSA Assay (FDA     approved for NASAL specimens     only), is one component of a     comprehensive MRSA colonization     surveillance program. It is not     intended to diagnose MRSA     infection nor to guide or     monitor treatment for     MRSA infections.    RADIOLOGY STUDIES/RESULTS: Ct Head Wo Contrast  07/26/2013   CLINICAL DATA:  Hypotensive, could sepsis  EXAM: CT HEAD WITHOUT CONTRAST  TECHNIQUE: Contiguous axial images were obtained from the base of the skull through the vertex without intravenous contrast.  COMPARISON:  Prior CT from 06/09/2013  FINDINGS: Advanced age-related atrophy with chronic microvascular ischemic changes noted. Prominent vascular calcifications noted within the carotid siphons bilaterally.  There is no acute intracranial hemorrhage or infarct. No mass lesion or midline shift. Gray-white matter differentiation is well  maintained. Ventricles are normal in size without evidence of hydrocephalus. CSF containing spaces are within normal limits. No extra-axial fluid collection.  The calvarium is intact.  Orbital soft tissues are within normal limits.  The paranasal sinuses and mastoid air cells are well pneumatized and free of fluid.  Scalp soft tissues are unremarkable.  IMPRESSION: 1. No acute intracranial process. 2. Advanced atrophy with chronic microvascular ischemic disease, unchanged.   Electronically Signed   By: Rise Mu M.D.   On: 07/26/2013 02:32   Dg Chest Port 1 View  (if Code Sepsis Called)  07/26/2013   CLINICAL DATA:  Hypotension.  EXAM: PORTABLE CHEST - 1 VIEW  COMPARISON:  DG CHEST 2 VIEW dated 05/15/2013; DG CHEST 1V PORT dated 10/09/2012; DG CHEST 2 VIEW dated 07/13/2008; DG CHEST 2 VIEW dated 02/06/2005  FINDINGS: Cardiac silhouette normal in size for AP portable technique. Thoracic aorta atherosclerotic, unchanged. Lungs clear. Bronchovascular markings normal. Pulmonary vascularity normal. No visible pleural effusions. No pneumothorax.  IMPRESSION: No acute cardiopulmonary disease.   Electronically Signed   By: Hulan Saas M.D.   On: 07/26/2013 01:40  Jeoffrey Massed, MD  [facility] Pager:[phone]  If 7PM-7AM, please contact night-coverage [email or web address] Password [facility] 07/31/2013, 12:57 PM   LOS: 5 days

## 2013-07-31 NOTE — Clinical Documentation Improvement (Signed)
Possible Clinical Conditions?   "  Coma  "  Other Condition  "  Cannot Clinically Determine     Risk Factors: Non-verbal, does not awaken to voice per 3/25 progress notes. Unresponsive to voice or touch per 3/26 progress notes.    Thank You, Marciano SequinWanda Mathews-Bethea,RN,BSN, Clinical Documentation Specialist:  [phone]  [facility]- Health Information Management

## 2013-07-31 NOTE — Progress Notes (Signed)
Inpatient [facility] Rm 5W25 P. Fine [facility] RN Visit- M. Konrad DoloresLester, RN  [facility]; DNR code status  Pt seen at bedside, pale, more drawn, RR= 8 with intermittent 5-10 sec pauses noted; continues with no PO intake; foley with dark urine with sediment; continuous Morphine drip at 1 mg/hr; receiving scheduled Ativan however does continue to exhibit intermittent spasms with repositioning; opening eyes occasionly to voice today; discussed pt's status with staff RN Roxan HockeyShinita; pt continues to require on going assessment for non-verbal s/sx of discomfort.   -Dtr Krista [name] at bedside, she stated she and sister, Krista [name] have talked and are agreeable to pt transfer to [facility] if/when bed available and it still made sense for pt to transfer.  - Per Krista [name], [facility] Director Support Services, a [facility] bed will be available for pt on Sunday 3/29 and [facility] team SW Blake DivineVanessa Royce will follow up with family to co-ordinate transfer. Writer informed dtr, Krista [name] of [facility] bed availability and she voiced agreement and appreciation for a plan moving forward; she is aware [facility] SW Erie NoeVanessa will be following up on weekend.  Beverely PaceBryant SW contacted and aware of above   [facility] continues to follow please contact [phone] with any hospice needs/concerns  Valente DavidMargie Lizanne [name], [facility]  Hospital Liaison  [phone]

## 2013-07-31 NOTE — Progress Notes (Signed)
Chaplain gave grief support to pt's daughters and their husbands.  Pt's family expressed gratitude for their mother's life of courage and love for family.  Chaplain engaged in compassionate conversation, offered empathic listening and prayed with family.     07/31/13 2300  Clinical Encounter Type  Visited With Patient and family together  Visit Type Spiritual support  Referral From Nurse  Spiritual Encounters  Spiritual Needs Grief support;Emotional;Prayer  Stress Factors  Family Stress Factors Health changes;Major life changes    Rulon Abideavid B Sherrod, chaplain pager [phone]

## 2013-08-01 LAB — CULTURE, BLOOD (ROUTINE X 2)
CULTURE: NO GROWTH
Culture: NO GROWTH

## 2013-08-01 NOTE — Progress Notes (Signed)
Inpatient [facility] Rm 5W25 P. [facility]arg[facility] Visit- Ulis RiasErin Osborne , RN    [facility]; DNR code status. Patient is unresponsive, on a morphine gtt at 1mg /hr. Kim at bedside and she stated that someone had [facility]. Informed daughter that Clinical research associatewriter would check with care team tomorrow to see if [facility] are [facility].    [facility] continues to follow please contact [phone] with any hospice needs/concerns Ulis RiasErin Osborne RN

## 2013-08-01 NOTE — Progress Notes (Addendum)
PATIENT DETAILS Name: Krista [name] Age: 73 y.o. Sex: female Date of Birth: 10/26/1940 Admit Date: 07/26/2013 Admitting Physician Levert FeinsteinJames C Ford, MD ZOX:[id number]:Javier Sharen HonesGutierrez, MD  Subjective: Krista SalkBarely responsive-her resp rate has decreased significantly  Assessment/Plan: Septic shock Shock Liver Severe dementia UTI Chronic kidney disease History of CVA Severe protein calorie malnutrition  Currently; continue with full overt measures, continue IV morphine. Expect inpatient death.  Disposition: Remain inpatient  DVT Prophylaxis: Not needed-comfort measures  Code Status: DNR  Family Communication Daughter at bedside 3/27  Procedures:  None  CONSULTS:  pulmonary/intensive care  MEDICATIONS: Scheduled Meds: . acetaminophen  650 mg Rectal Once  . LORazepam  0.5 mg Intravenous 6 times per day   Continuous Infusions: . morphine 1 mg/hr (07/29/13 0900)   PRN Meds:.acetaminophen, morphine injection  Antibiotics: Anti-infectives   Start     Dose/Rate Route Frequency Ordered Stop   07/28/13 0600  vancomycin (VANCOCIN) 500 mg in sodium chloride 0.9 % 100 mL IVPB  Status:  Discontinued     500 mg 100 mL/hr over 60 Minutes Intravenous Every 48 hours 07/26/13 0700 07/26/13 0828   07/26/13 1000  piperacillin-tazobactam (ZOSYN) IVPB 3.375 g  Status:  Discontinued     3.375 g 12.5 mL/hr over 240 Minutes Intravenous Every 8 hours 07/26/13 0324 07/26/13 0828   07/26/13 0600  piperacillin-tazobactam (ZOSYN) IVPB 2.25 g  Status:  Discontinued     2.25 g 100 mL/hr over 30 Minutes Intravenous 4 times per day 07/26/13 0259 07/26/13 0323   07/26/13 0600  vancomycin (VANCOCIN) 50 mg/mL oral solution 125 mg  Status:  Discontinued     125 mg Oral 4 times per day 07/26/13 0259 07/27/13 1152   07/26/13 0300  metroNIDAZOLE (FLAGYL) IVPB 500 mg  Status:  Discontinued     500 mg 100 mL/hr over 60 Minutes Intravenous Every 8 hours 07/26/13 0259 07/27/13 1152   07/26/13 0115  vancomycin  (VANCOCIN) IVPB 1000 mg/200 mL premix     1,000 mg 200 mL/hr over 60 Minutes Intravenous  Once 07/26/13 0110 07/26/13 0218   07/26/13 0115  piperacillin-tazobactam (ZOSYN) IVPB 3.375 g     3.375 g 100 mL/hr over 30 Minutes Intravenous  Once 07/26/13 0110 07/26/13 0145       PHYSICAL EXAM: Vital signs in last 24 hours: Filed Vitals:   07/31/13 0521 07/31/13 1300 07/31/13 2103 08/01/13 0455  BP: 120/71 117/69 133/71 120/73  Pulse: 71 82 70 74  Temp: 98 F (36.7 C) 97.8 F (36.6 C) 98.5 F (36.9 C) 97.9 F (36.6 C)  TempSrc: Axillary Axillary Axillary Axillary  Resp: 18 18 18 6   Height:      Weight:      SpO2: 92% 93% 96% 95%    Weight change:  Filed Weights   07/26/13 0415  Weight: 45.3 kg (99 lb 13.9 oz)   Body mass index is 16.62 kg/(m^2).   Gen Exam: Unresponsive  Neck: Supple, No JVD.   Chest: B/L Clear.   CVS: S1 S2 Regular, no murmurs.  Abdomen: soft, BS +, non tender, non distended.  Extremities: no edema, lower extremities warm to touch. Skin: No Rash.  Wounds: N/A.    Intake/Output from previous day:  Intake/Output Summary (Last 24 hours) at 08/01/13 1114 Last data filed at 08/01/13 0456  Gross per 24 hour  Intake      0 ml  Output    450 ml  Net   -450 ml  LAB RESULTS: CBC  Recent Labs Lab 07/26/13 0055 07/26/13 0535  WBC 4.6 13.1*  HGB 9.1* 12.5  HCT 26.7* 37.0  PLT 153 161  MCV 89.0 89.6  MCH 30.3 30.3  MCHC 34.1 33.8  RDW 14.4 14.6  LYMPHSABS 0.7 1.2  MONOABS 0.2 0.5  EOSABS 0.0 0.0  BASOSABS 0.0 0.0    Chemistries   Recent Labs Lab 07/26/13 0055 07/26/13 0254 07/26/13 0535  NA 141  --  139  K 4.1  --  3.9  CL 107  --  104  CO2 19  --  18*  GLUCOSE 166*  --  198*  BUN 22  --  24*  CREATININE 1.21*  --  1.13*  CALCIUM 7.4*  --  7.4*  MG  --  1.7  --     CBG:  Recent Labs Lab 07/26/13 0420  GLUCAP 175*    GFR Estimated Creatinine Clearance: 31.7 ml/min (by C-G formula based on Cr of  1.13).  Coagulation profile No results found for this basename: INR, PROTIME,  in the last 168 hours  Cardiac Enzymes No results found for this basename: CK, CKMB, TROPONINI, MYOGLOBIN,  in the last 168 hours  No components found with this basename: POCBNP,  No results found for this basename: DDIMER,  in the last 72 hours No results found for this basename: HGBA1C,  in the last 72 hours No results found for this basename: CHOL, HDL, LDLCALC, TRIG, CHOLHDL, LDLDIRECT,  in the last 72 hours No results found for this basename: TSH, T4TOTAL, FREET3, T3FREE, THYROIDAB,  in the last 72 hours No results found for this basename: VITAMINB12, FOLATE, FERRITIN, TIBC, IRON, RETICCTPCT,  in the last 72 hours No results found for this basename: LIPASE, AMYLASE,  in the last 72 hours  Urine Studies No results found for this basename: UACOL, UAPR, USPG, UPH, UTP, UGL, UKET, UBIL, UHGB, UNIT, UROB, ULEU, UEPI, UWBC, URBC, UBAC, CAST, CRYS, UCOM, BILUA,  in the last 72 hours  MICROBIOLOGY: Recent Results (from the past 240 hour(s))  CLOSTRIDIUM DIFFICILE BY PCR     Status: None   Collection Time    07/26/13 12:46 AM      Result Value Ref Range Status   C difficile by pcr NEGATIVE  NEGATIVE Final  CULTURE, BLOOD (ROUTINE X 2)     Status: None   Collection Time    07/26/13 12:55 AM      Result Value Ref Range Status   Specimen Description BLOOD LEFT HAND   Final   Special Requests BOTTLES DRAWN AEROBIC AND ANAEROBIC 5CC EA   Final   Culture  Setup Time     Final   Value: 07/26/2013 13:41     Performed at [facility]   Culture     Final   Value:        BLOOD CULTURE RECEIVED NO GROWTH TO DATE CULTURE WILL BE HELD FOR 5 DAYS BEFORE ISSUING A FINAL NEGATIVE REPORT     Performed at [facility]   Report Status PENDING   Incomplete  CULTURE, BLOOD (ROUTINE X 2)     Status: None   Collection Time    07/26/13  1:00 AM      Result Value Ref Range Status   Specimen Description BLOOD  RIGHT FOREARM   Final   Special Requests     Final   Value: BOTTLES DRAWN AEROBIC AND ANAEROBIC 7CC BLUE 8CC RED   Culture  Setup Time  Final   Value: 07/26/2013 13:42     Performed at [facility]   Culture     Final   Value:        BLOOD CULTURE RECEIVED NO GROWTH TO DATE CULTURE WILL BE HELD FOR 5 DAYS BEFORE ISSUING A FINAL NEGATIVE REPORT     Performed at [facility]   Report Status PENDING   Incomplete  URINE CULTURE     Status: None   Collection Time    07/26/13  3:08 AM      Result Value Ref Range Status   Specimen Description URINE, RANDOM   Final   Special Requests NONE   Final   Culture  Setup Time     Final   Value: 07/26/2013 14:47     Performed at [facility] Count     Final   Value: NO GROWTH     Performed at [facility]   Culture     Final   Value: NO GROWTH     Performed at [facility]   Report Status 07/27/2013 FINAL   Final  MRSA PCR SCREENING     Status: None   Collection Time    07/26/13  4:18 AM      Result Value Ref Range Status   MRSA by PCR NEGATIVE  NEGATIVE Final   Comment:            The GeneXpert MRSA Assay (FDA     approved for NASAL specimens     only), is one component of a     comprehensive MRSA colonization     surveillance program. It is not     intended to diagnose MRSA     infection nor to guide or     monitor treatment for     MRSA infections.    RADIOLOGY STUDIES/RESULTS: Ct Head Wo Contrast  07/26/2013   CLINICAL DATA:  Hypotensive, could sepsis  EXAM: CT HEAD WITHOUT CONTRAST  TECHNIQUE: Contiguous axial images were obtained from the base of the skull through the vertex without intravenous contrast.  COMPARISON:  Prior CT from 06/09/2013  FINDINGS: Advanced age-related atrophy with chronic microvascular ischemic changes noted. Prominent vascular calcifications noted within the carotid siphons bilaterally.  There is no acute intracranial hemorrhage or infarct. No mass lesion or  midline shift. Gray-white matter differentiation is well maintained. Ventricles are normal in size without evidence of hydrocephalus. CSF containing spaces are within normal limits. No extra-axial fluid collection.  The calvarium is intact.  Orbital soft tissues are within normal limits.  The paranasal sinuses and mastoid air cells are well pneumatized and free of fluid.  Scalp soft tissues are unremarkable.  IMPRESSION: 1. No acute intracranial process. 2. Advanced atrophy with chronic microvascular ischemic disease, unchanged.   Electronically Signed   By: Rise Mu M.D.   On: 07/26/2013 02:32   Dg Chest Port 1 View  (if Code Sepsis Called)  07/26/2013   CLINICAL DATA:  Hypotension.  EXAM: PORTABLE CHEST - 1 VIEW  COMPARISON:  DG CHEST 2 VIEW dated 05/15/2013; DG CHEST 1V PORT dated 10/09/2012; DG CHEST 2 VIEW dated 07/13/2008; DG CHEST 2 VIEW dated 02/06/2005  FINDINGS: Cardiac silhouette normal in size for AP portable technique. Thoracic aorta atherosclerotic, unchanged. Lungs clear. Bronchovascular markings normal. Pulmonary vascularity normal. No visible pleural effusions. No pneumothorax.  IMPRESSION: No acute cardiopulmonary disease.   Electronically Signed   By: Kayren Eaves.D.  On: 07/26/2013 01:40    Jeoffrey Massed, MD  [facility] Pager:[phone]  If 7PM-7AM, please contact night-coverage [email or web address] Password [email or web address] 08/01/2013, 11:14 AM   LOS: 6 days

## 2013-08-02 DIAGNOSIS — K7200 Acute and subacute hepatic failure without coma: Secondary | ICD-10-CM

## 2013-08-02 MED ORDER — MORPHINE SULFATE 2 MG/ML IJ SOLN: 2.0000 mg | INTRAMUSCULAR | Status: AC | PRN

## 2013-08-02 MED ORDER — LORAZEPAM 2 MG/ML IJ SOLN: 0.5000 mg | INTRAMUSCULAR | Status: AC

## 2013-08-02 MED ORDER — SODIUM CHLORIDE 0.9 % IV SOLN: 2.0000 mg/h | INTRAVENOUS | Status: AC

## 2013-08-02 NOTE — Clinical Social Work Note (Signed)
[facility] to inform that MD states patient is stable enough for transport. Facility can accept patient today. Facility asks that CSW fax DC summary to facility before sending patient. CSW notified MD.  Roddie McBryant Keauna Brasel, East EndLCSWA, [place], [phone]

## 2013-08-02 NOTE — Plan of Care (Signed)
Problem: Phase I Progression Outcomes Goal: Voiding-avoid urinary catheter unless indicated Outcome: Not Met (add Reason) Urinary Catheter in for comfort/end of life

## 2013-08-02 NOTE — Progress Notes (Signed)
[facility] MD verbal order to maintain IV access and leave in urinary catheter for d/c

## 2013-08-02 NOTE — Progress Notes (Signed)
Stopped pt's morphine drip at d/c. Tubing thrown out in black box in pyxis room. Content measured at 162 cc Morphine 1mg /mL in sodium chloride bag and wasted in sink. Warrick Parisianachel L'esparance RN witness waste

## 2013-08-02 NOTE — Clinical Social Work Note (Signed)
Per MD patient ready to Dc to [facility]. CSW attempted to contact patient's daughter Cala BradfordKimberly by phone [facility] number listed. CSW left message explaining that [facility] and that the RN will call her once EMS has arrived to transport patient. Patient's DC packet on chart. Ambulance transport requested for patient. CSW also left message with patient's other daughter Bjorn LoserRhonda.   Roddie McBryant Indira Sorenson, [place]CSWA, [place]AS[place], [phone]

## 2013-08-02 NOTE — Progress Notes (Signed)
[facility] IV access and urinary catheter remained per verbal MD order. Pt transported via EMS. Vital signs adequate for discharge. Comfort care maintained.

## 2013-08-02 NOTE — Discharge Summary (Signed)
PATIENT DETAILS Name: Krista [name] Age: 73 y.o. Sex: female Date of Birth: 12-22-1940 MRN: [id number]. Admit Date: 07/26/2013 Admitting Physician: Levert Feinstein, MD AOZ:[id number] Sharen Hones, MD  Recommendations for Outpatient Follow-up:  1. Optimize Comfort medications  PRIMARY DISCHARGE DIAGNOSIS:  Principal Problem: Septic shock Active Problems:   Dementia  Shock Liver UTI Diarrhea   DNR (do not resuscitate)      PAST MEDICAL HISTORY: Past Medical History  Diagnosis Date  . Hypertension   . Hypothyroidism   . HLD (hyperlipidemia)   . Osteoporosis   . Dementia   . Vitamin D deficiency   . RLS (restless legs syndrome)   . Allergic rhinitis   . Incontinence     Bowel and bladder  . Hx: UTI (urinary tract infection)   . History of asthma   . Depression   . GAD (generalized anxiety disorder)   . History of CVA (cerebrovascular accident)     old by CT scan 2014  . Hospice care patient     DNR/DNI  . DNR (do not resuscitate)     DISCHARGE MEDICATIONS:   Medication List    STOP taking these medications       acetaminophen 500 MG tablet  Commonly known as:  TYLENOL     atorvastatin 40 MG tablet  Commonly known as:  LIPITOR     clonazePAM 0.5 MG tablet  Commonly known as:  KLONOPIN     feeding supplement (ENSURE COMPLETE) Liqd     levothyroxine 25 MCG tablet  Commonly known as:  SYNTHROID, LEVOTHROID     pramipexole 0.25 MG tablet  Commonly known as:  MIRAPEX     sertraline 50 MG tablet  Commonly known as:  ZOLOFT     THICK-IT Powd  Generic drug:  STARCH-MALTO DEXTRIN     traZODone 50 MG tablet  Commonly known as:  DESYREL      TAKE these medications       LORazepam 2 MG/ML injection  Commonly known as:  ATIVAN  Inject 0.25 mLs (0.5 mg total) into the vein every 4 (four) hours.     morphine 100 mg in sodium chloride 0.9 % 90 mL  Inject 2 mg/hr into the vein continuous.     morphine 2 MG/ML injection  Inject 1-2 mLs (2-4 mg total) into the  vein every hour as needed (resp distress).        ALLERGIES:   Allergies  Allergen Reactions  . Exelon [Rivastigmine Tartrate] Other (See Comments)    Patch caused skin rash  . Sulfa Antibiotics Other (See Comments)    Unknown reaction per pt's daughters    BRIEF HPI:  See H&P, Labs, Consult and Test reports for all details in brief,73 yo female, residing in respite care due to care givers recent surgery, who presents from facility with AMS, temp of 105, diarrhea, and multiorgan failure with shock.   CONSULTATIONS:   pulmonary/intensive care  PERTINENT RADIOLOGIC STUDIES: Ct Head Wo Contrast  07/26/2013   CLINICAL DATA:  Hypotensive, could sepsis  EXAM: CT HEAD WITHOUT CONTRAST  TECHNIQUE: Contiguous axial images were obtained from the base of the skull through the vertex without intravenous contrast.  COMPARISON:  Prior CT from 06/09/2013  FINDINGS: Advanced age-related atrophy with chronic microvascular ischemic changes noted. Prominent vascular calcifications noted within the carotid siphons bilaterally.  There is no acute intracranial hemorrhage or infarct. No mass lesion or midline shift. Gray-white matter differentiation is well maintained. Ventricles are normal in size  without evidence of hydrocephalus. CSF containing spaces are within normal limits. No extra-axial fluid collection.  The calvarium is intact.  Orbital soft tissues are within normal limits.  The paranasal sinuses and mastoid air cells are well pneumatized and free of fluid.  Scalp soft tissues are unremarkable.  IMPRESSION: 1. No acute intracranial process. 2. Advanced atrophy with chronic microvascular ischemic disease, unchanged.   Electronically Signed   By: Rise Mu M.D.   On: 07/26/2013 02:32   Dg Chest Port 1 View  (if Code Sepsis Called)  07/26/2013   CLINICAL DATA:  Hypotension.  EXAM: PORTABLE CHEST - 1 VIEW  COMPARISON:  DG CHEST 2 VIEW dated 05/15/2013; DG CHEST 1V PORT dated 10/09/2012; DG CHEST 2  VIEW dated 07/13/2008; DG CHEST 2 VIEW dated 02/06/2005  FINDINGS: Cardiac silhouette normal in size for AP portable technique. Thoracic aorta atherosclerotic, unchanged. Lungs clear. Bronchovascular markings normal. Pulmonary vascularity normal. No visible pleural effusions. No pneumothorax.  IMPRESSION: No acute cardiopulmonary disease.   Electronically Signed   By: Hulan Saas M.D.   On: 07/26/2013 01:40     PERTINENT LAB RESULTS: CBC: No results found for this basename: WBC, HGB, HCT, PLT,  in the last 72 hours CMET CMP     Component Value Date/Time   NA 139 07/26/2013 0535   NA 146 09/08/2012   K 3.9 07/26/2013 0535   CL 104 07/26/2013 0535   CO2 18* 07/26/2013 0535   GLUCOSE 198* 07/26/2013 0535   BUN 24* 07/26/2013 0535   BUN 20 09/08/2012   CREATININE 1.13* 07/26/2013 0535   CREATININE 1.0 09/08/2012   CALCIUM 7.4* 07/26/2013 0535   CALCIUM 9.0 09/08/2012   PROT 5.1* 07/26/2013 0535   ALBUMIN 2.4* 07/26/2013 0535   AST 1601* 07/26/2013 0535   AST 24 09/08/2012   ALT 1254* 07/26/2013 0535   ALKPHOS 95 07/26/2013 0535   ALKPHOS 78 09/08/2012   BILITOT 0.5 07/26/2013 0535   BILITOT 0.4 09/08/2012   GFRNONAA 47* 07/26/2013 0535   GFRAA 54* 07/26/2013 0535    GFR Estimated Creatinine Clearance: 31.7 ml/min (by C-G formula based on Cr of 1.13). No results found for this basename: LIPASE, AMYLASE,  in the last 72 hours No results found for this basename: CKTOTAL, CKMB, CKMBINDEX, TROPONINI,  in the last 72 hours No components found with this basename: POCBNP,  No results found for this basename: DDIMER,  in the last 72 hours No results found for this basename: HGBA1C,  in the last 72 hours No results found for this basename: CHOL, HDL, LDLCALC, TRIG, CHOLHDL, LDLDIRECT,  in the last 72 hours No results found for this basename: TSH, T4TOTAL, FREET3, T3FREE, THYROIDAB,  in the last 72 hours No results found for this basename: VITAMINB12, FOLATE, FERRITIN, TIBC, IRON, RETICCTPCT,  in the last 72  hours Coags: No results found for this basename: PT, INR,  in the last 72 hours Microbiology: Recent Results (from the past 240 hour(s))  CLOSTRIDIUM DIFFICILE BY PCR     Status: None   Collection Time    07/26/13 12:46 AM      Result Value Ref Range Status   C difficile by pcr NEGATIVE  NEGATIVE Final  CULTURE, BLOOD (ROUTINE X 2)     Status: None   Collection Time    07/26/13 12:55 AM      Result Value Ref Range Status   Specimen Description BLOOD LEFT HAND   Final   Special Requests BOTTLES DRAWN AEROBIC AND  ANAEROBIC 5CC EA   Final   Culture  Setup Time     Final   Value: 07/26/2013 13:41     [facility]   Culture     Final   Value: NO GROWTH 5 DAYS     Performed at [facility]   Report Status 08/01/2013 FINAL   Final  CULTURE, BLOOD (ROUTINE X 2)     Status: None   Collection Time    07/26/13  1:00 AM      Result Value Ref Range Status   Specimen Description BLOOD RIGHT FOREARM   Final   Special Requests     Final   Value: BOTTLES DRAWN AEROBIC AND ANAEROBIC 7CC BLUE 8CC RED   Culture  Setup Time     Final   Value: 07/26/2013 13:42     Performed at [facility]   Culture     Final   Value: NO GROWTH 5 DAYS     Performed at [facility]   Report Status 08/01/2013 FINAL   Final  URINE CULTURE     Status: None   Collection Time    07/26/13  3:08 AM      Result Value Ref Range Status   Specimen Description URINE, RANDOM   Final   Special Requests NONE   Final   Culture  Setup Time     Final   Value: 07/26/2013 14:47     Performed at [facility]   Colony Count     Final   Value: NO GROWTH     Performed at [facility]   Culture     Final   Value: NO GROWTH     Performed at [facility]   Report Status 07/27/2013 FINAL   Final  MRSA PCR SCREENING     Status: None   Collection Time    07/26/13  4:18 AM      Result Value Ref Range Status   MRSA by PCR NEGATIVE  NEGATIVE Final   Comment:             The GeneXpert MRSA Assay (FDA     approved for NASAL specimens     only), is one component of a     comprehensive MRSA colonization     surveillance program. It is not     intended to diagnose MRSA     infection nor to guide or     monitor treatment for     MRSA infections.     BRIEF HOSPITAL COURSE:  Septic shock  Shock Liver  Severe dementia  UTI  Chronic kidney disease  History of CVA  Severe protein calorie malnutrition  Patient was admitted to the ICU by PCCM-given IV Fluid resuscitation, remained hypotensive, transiently recieved pressors peripherally as family refusing central line;also received Vanc IV and down NG/Flagyl/Zosyn. [facility], family then transitioned to full comfort status and was started on a morphine gtt. Continues to slowly deteriorate, barely responsive while on morphine gtt, but essentially unchanged the past few days, [facility], will transfer there for further continued comfort needs.Spoke with daughter Josph MachoKimberly Kittell over the phone, she was agreeable.   TODAY-DAY OF DISCHARGE:  Subjective:   Sheala Cantera today is essentially unchanged compared to the past few days.No PO intake at all since admission. Barely responsive on a morphine gtt.  Objective:   Blood  pressure 124/64, pulse 83, temperature 97.6 F (36.4 C), temperature source Axillary, resp. rate 18, height 5\' 5"  (1.651 m), weight 45.3 kg (99 lb 13.9 oz), SpO2 93.00%.  Intake/Output Summary (Last 24 hours) at 08/02/13 0930 Last data filed at 08/02/13 0500  Gross per 24 hour  Intake     22 ml  Output      0 ml  Net     22 ml   Filed Weights   07/26/13 0415  Weight: 45.3 kg (99 lb 13.9 oz)    Exam Gen Exam: Unresponsive  Neck: Supple, No JVD.  Chest: B/L Clear.  CVS: S1 S2 Regular, no murmurs.  Abdomen: soft, BS +, non tender, non distended.  Extremities: no edema, lower extremities warm to touch.  Skin:  No Rash.  Wounds: N/A.   DISCHARGE CONDITION: Poor  DISPOSITION: Residential Hospice  DISCHARGE INSTRUCTIONS:    Activity:  Bed Rest-end of life care  Diet recommendation: NPO       Future Appointments Provider Department Dept Phone   09/08/2013 12:15 PM Eustaquio Boyden, MD [facility] [phone]      Follow-up Information   Schedule an appointment as soon as possible for a visit with Eustaquio Boyden, MD. (As needed)    Specialty:  Family Medicine   Contact information:   [address] [place] [address] [phone]       Total Time spent on discharge equals 45 minutes.  SignedJeoffrey Massed 08/02/2013 9:30 AM

## 2013-09-04 DEATH — deceased

## 2013-09-08 ENCOUNTER — Ambulatory Visit: Payer: Medicare Other | Admitting: Family Medicine

## 2013-09-14 ENCOUNTER — Ambulatory Visit: Payer: Medicare Other | Admitting: Family Medicine

## 2014-10-13 IMAGING — CT CT ABD-PELV W/ CM
1 of 3 series · 14 of 32 positions shown, 19 images · IV contrast (omnipaque)
Comparison: None.

CLINICAL DATA: Abdominal pain

CT ABDOMEN AND PELVIS WITH CONTRAST
TECHNIQUE: Multidetector CT imaging of the abdomen and pelvis was
performed following the standard protocol during bolus
administration of intravenous contrast.
Contrast: 80mL OMNIPAQUE IOHEXOL 300 MG/ML  SOLN

[Series 2: abd/pel with · axial · 0.74mm/px · z∈[-505,-140]mm · 14 of 83 slices shown, 19 images]
[im 5/83  soft-tissue]
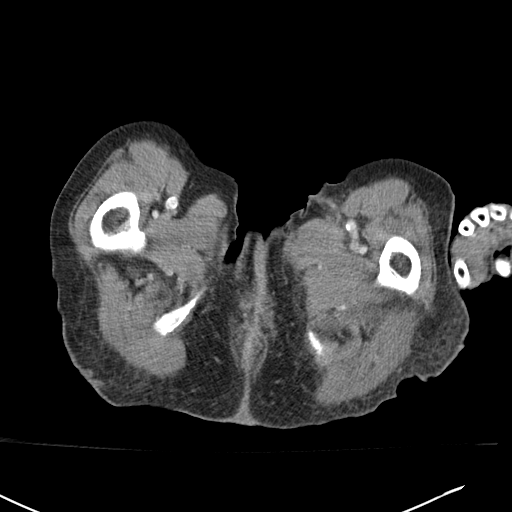
[im 5/83  bone]
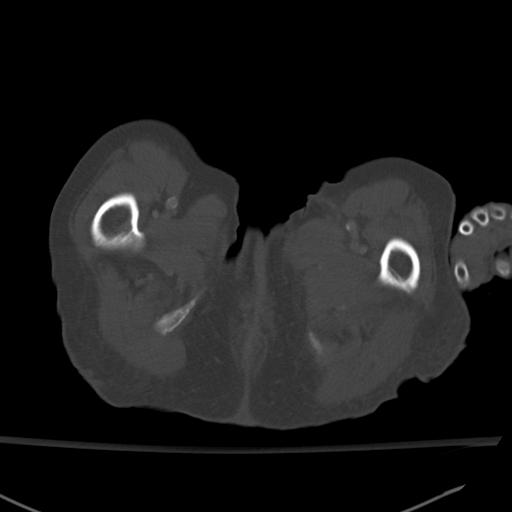
[im 10/83  soft-tissue]
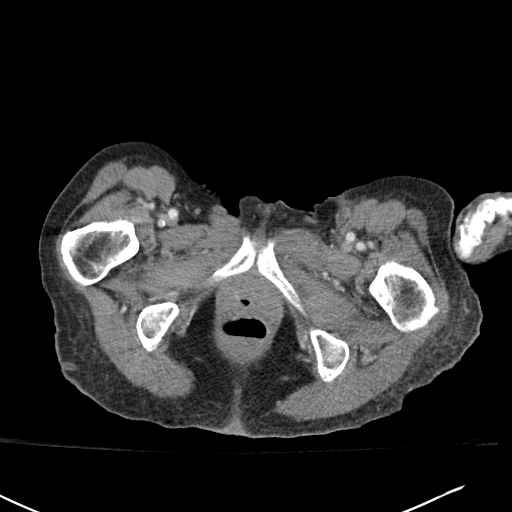
[im 19/83  soft-tissue]
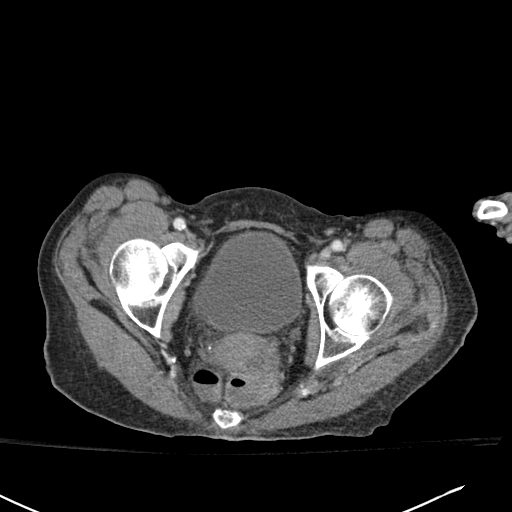
[im 23/83  soft-tissue]
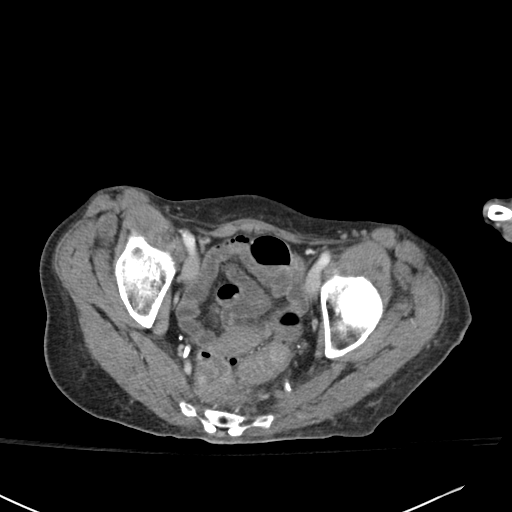
[im 28/83  soft-tissue]
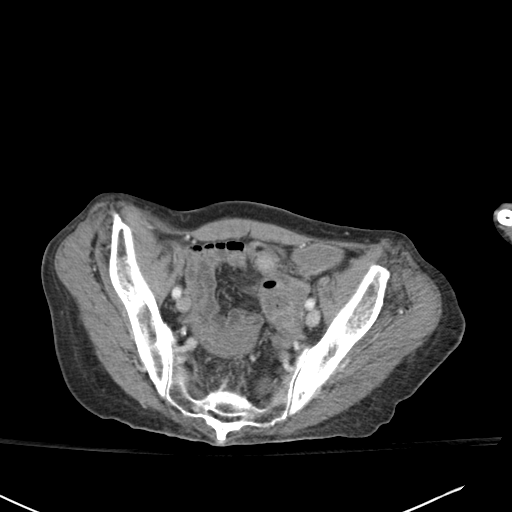
[im 37/83  soft-tissue]
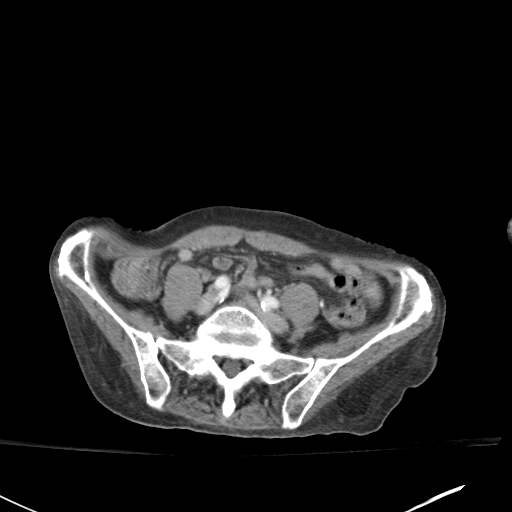
[im 42/83  soft-tissue]
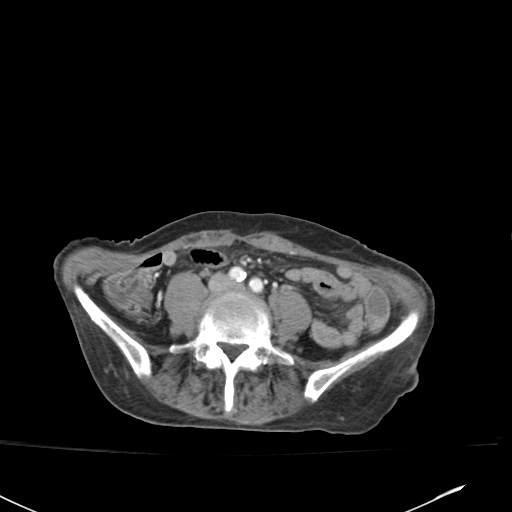
[im 46/83  soft-tissue]
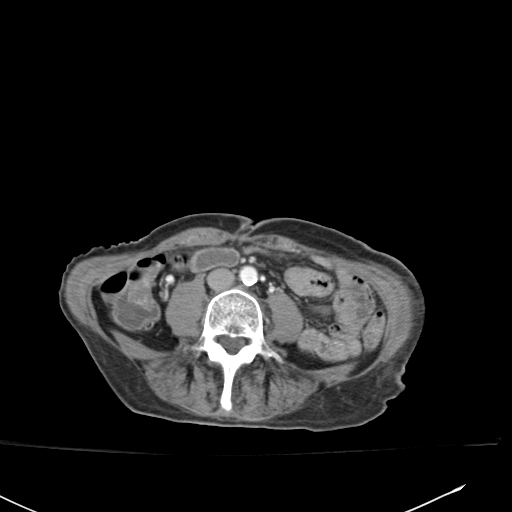
[im 55/83  soft-tissue]
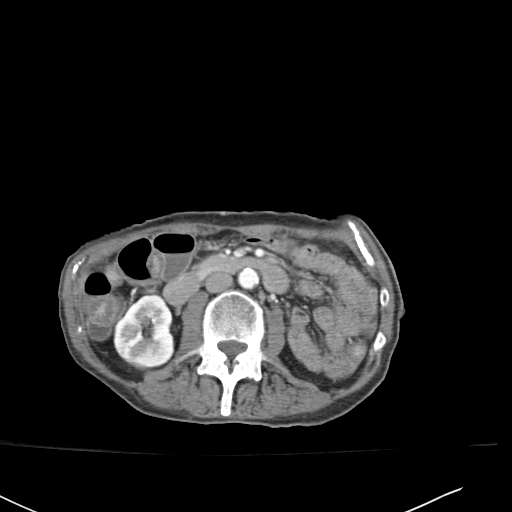
[im 55/83  bone]
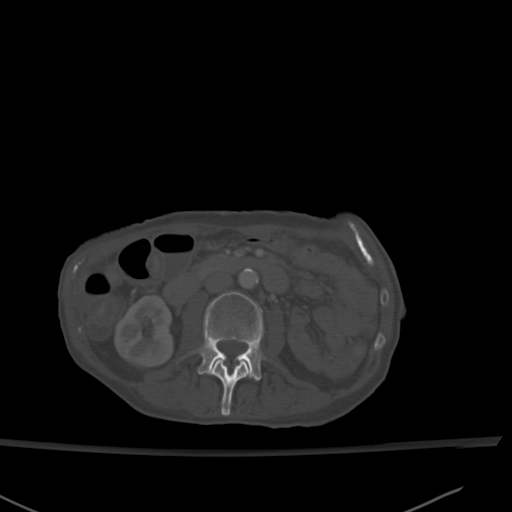
[im 60/83  soft-tissue]
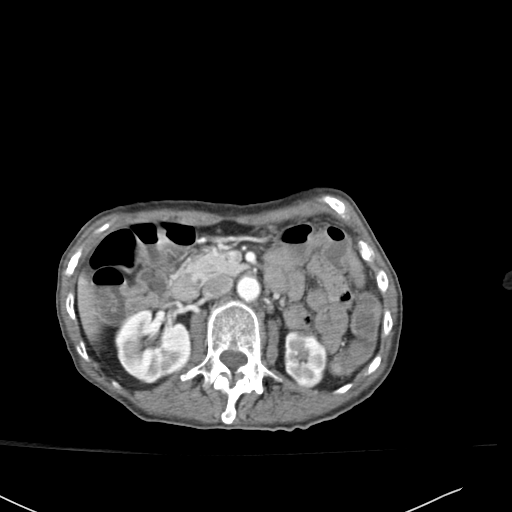
[im 64/83  soft-tissue]
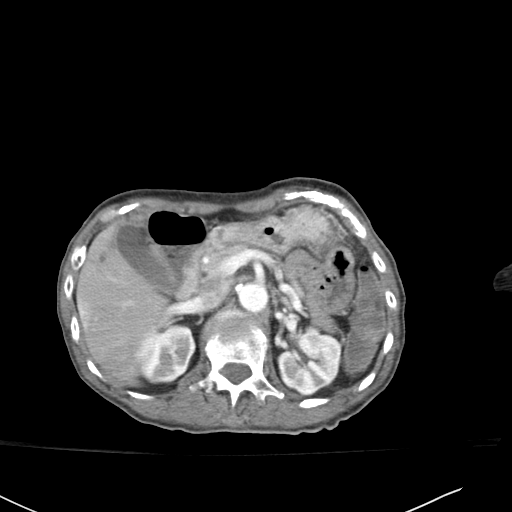
[im 64/83  lung]
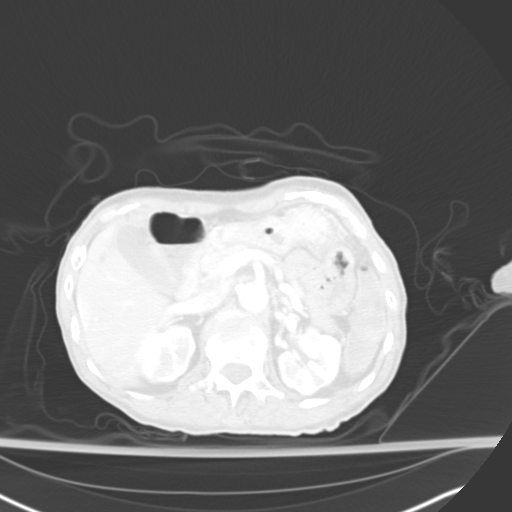
[im 69/83  lung]
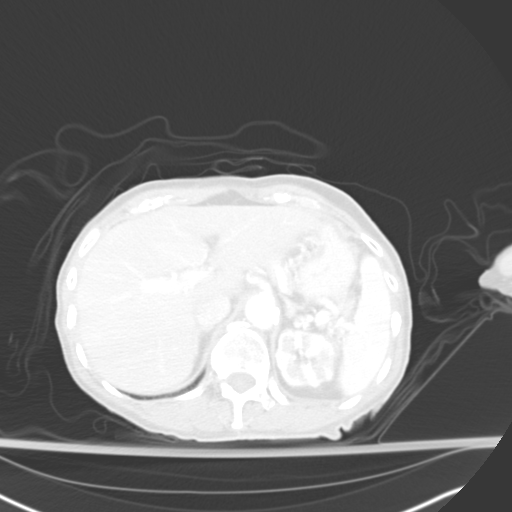
[im 73/83  soft-tissue]
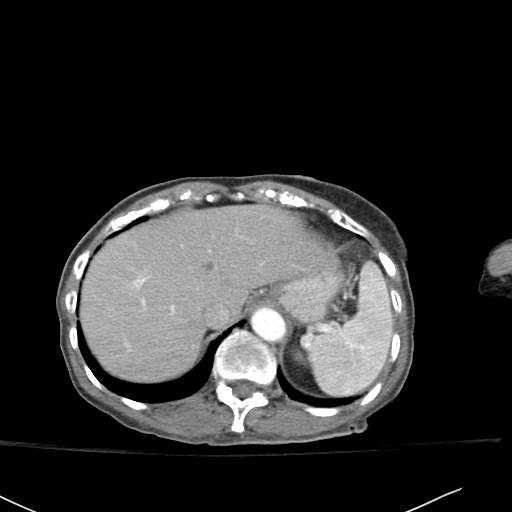
[im 73/83  lung]
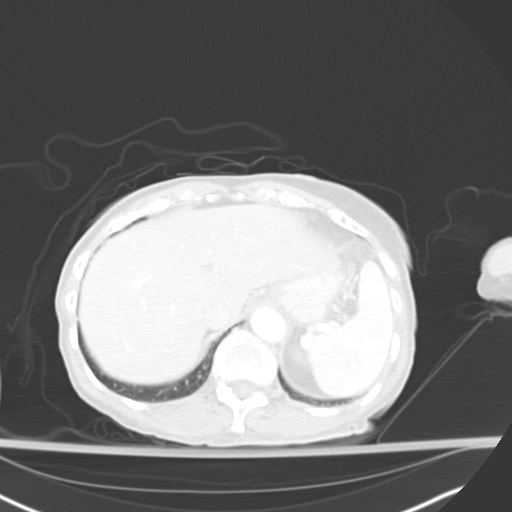
[im 78/83  soft-tissue]
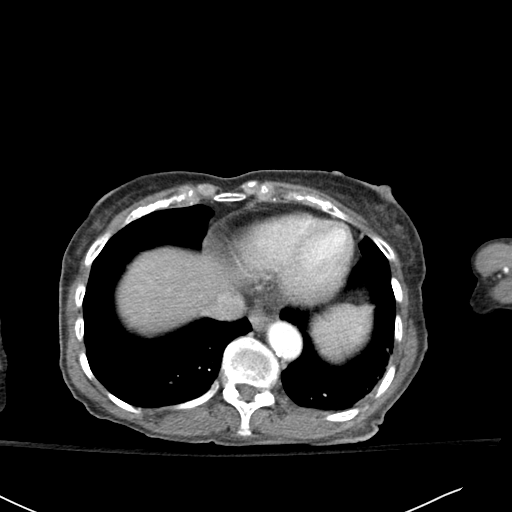
[im 78/83  lung]
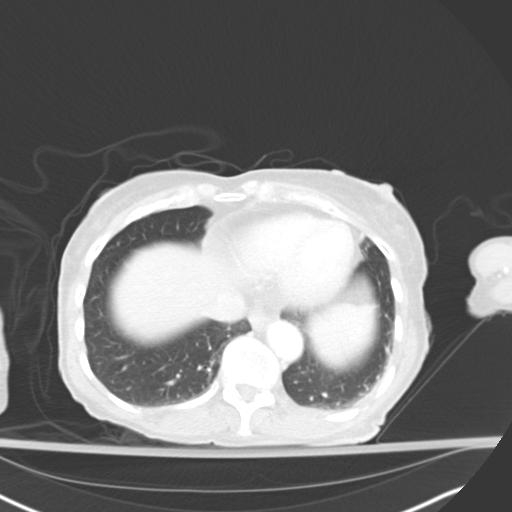

[14 of 32 positions shown; findings below may reference images not displayed]

FINDINGS: The lung bases appear clear.  No pleural or pericardial effusion.
A focal area of low attenuation within the left hepatic lobe
measures 7 x 9 mm, image 11/series 2.  Within the right hepatic
lobe there is a 7 mm low attenuation structure.

These are both too small to reliably characterize.  The gallbladder
appears within normal limits.  No biliary dilatation.  Normal
appearance of the pancreas.  The spleen is normal.  The adrenal
glands both appear normal.

Bilateral renal cortical thinning is noted.  Small cyst within the
inferior pole of the right kidney measures 6 mm and is too small to
reliably characterize.  No obstructive uropathy identified.  The
urinary bladder appears normal.  The uterus and the adnexal
structures have a normal physiologic appearance for patient's age.
No free fluid or fluid collection within the abdomen or pelvis.

The abdominal aorta has a normal caliber.  There is mild calcified
atherosclerotic change noted.  No upper abdominal adenopathy.
There is no pelvic or inguinal adenopathy noted.

The stomach appears normal.  The small bowel loops have a normal
course and caliber without evidence for bowel obstruction.  The
appendix is visualized and appears normal.  Normal appearance of
the colon.

Review of the visualized osseous structures is significant for
compression deformity involving the L1 vertebral body. This is
similar to 07/13/2008.
IMPRESSION: 1.  No acute findings identified within the abdomen or the pelvis.
2.  Bilateral renal cortical thinning.
3.  Low attenuation structures within the liver parenchyma are too
small to characterize.

## 2014-10-13 IMAGING — CT CT HEAD W/O CM
2 series · 16 of 30 positions shown, 20 images · non-contrast
Comparison: 10/09/2012.

CLINICAL DATA: Demented.  Altered mental status.  History of prior
urinary tract infection.

CT HEAD WITHOUT CONTRAST
TECHNIQUE: Contiguous axial images were obtained from the base of
the skull through the vertex without contrast.

[Series 2: head w/o · axial · non-contrast · 0.48mm/px · z∈[+1380,+1516]mm · 13 of 33 slices shown, 17 images]
[im 3/33  brain]
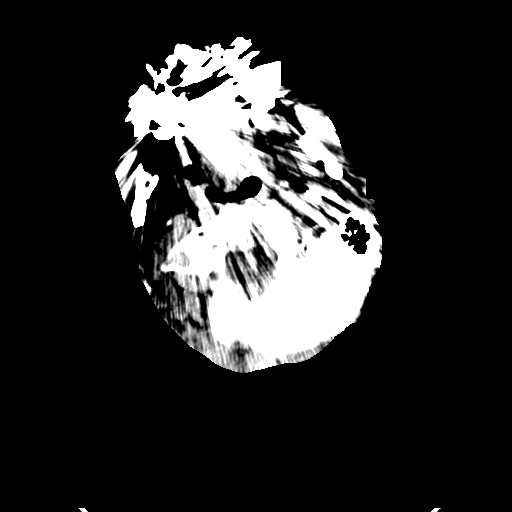
[im 3/33  bone]
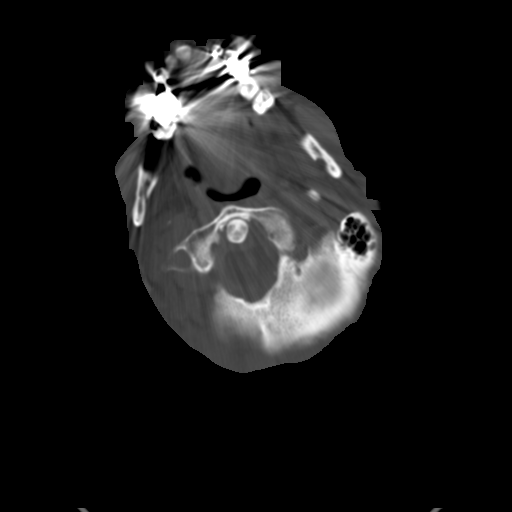
[im 5/33  brain]
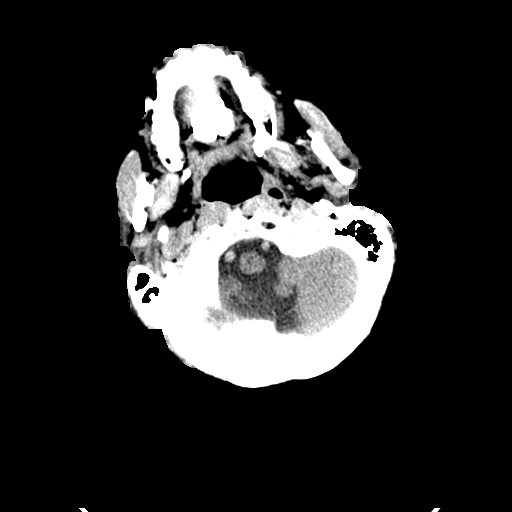
[im 7/33  brain]
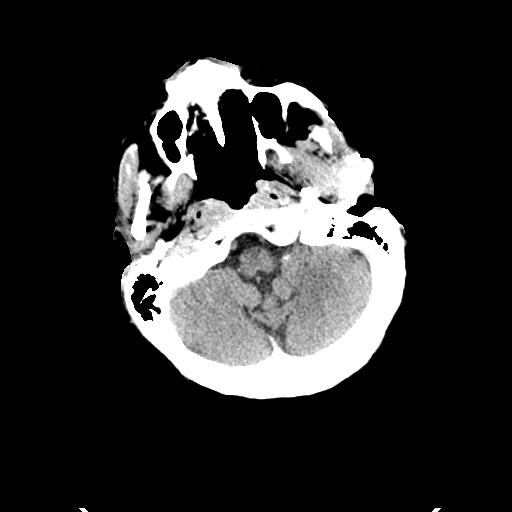
[im 10/33  brain]
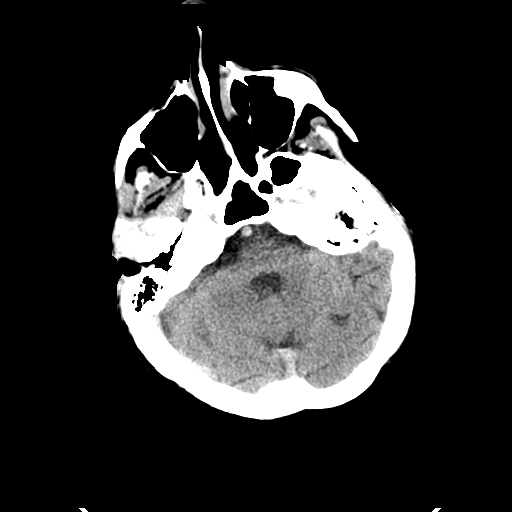
[im 12/33  brain]
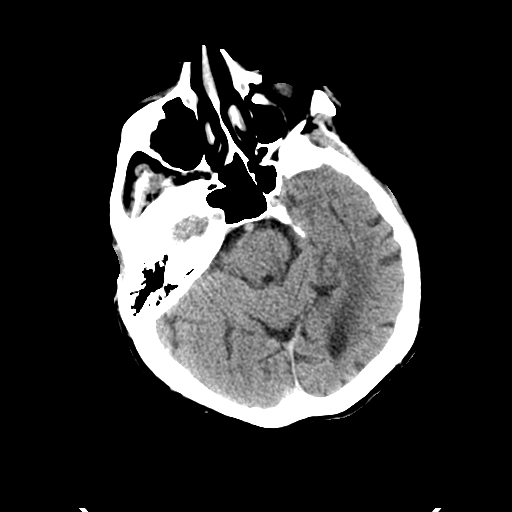
[im 12/33  bone]
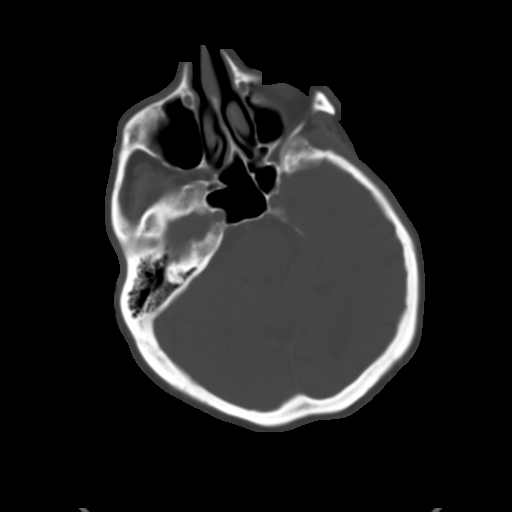
[im 14/33  brain]
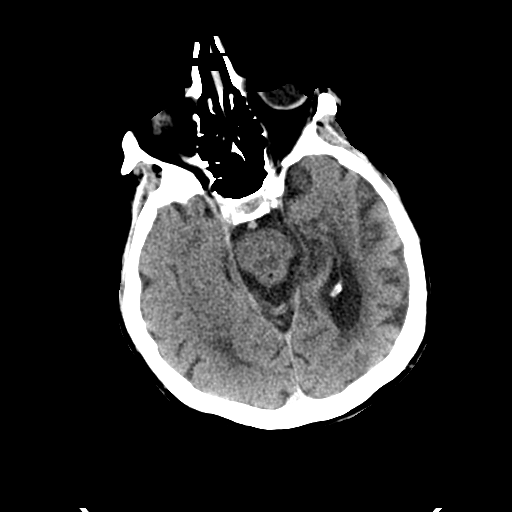
[im 17/33  brain]
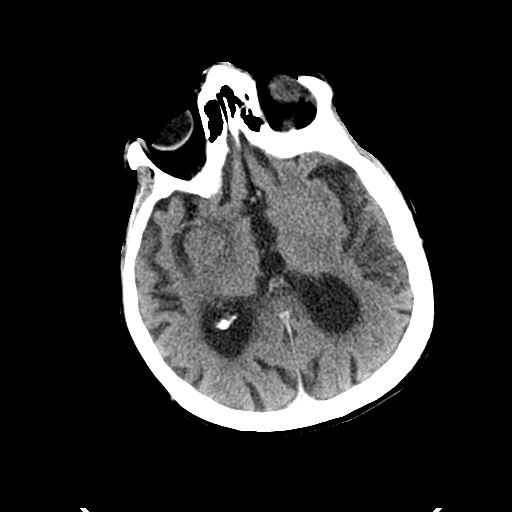
[im 19/33  brain]
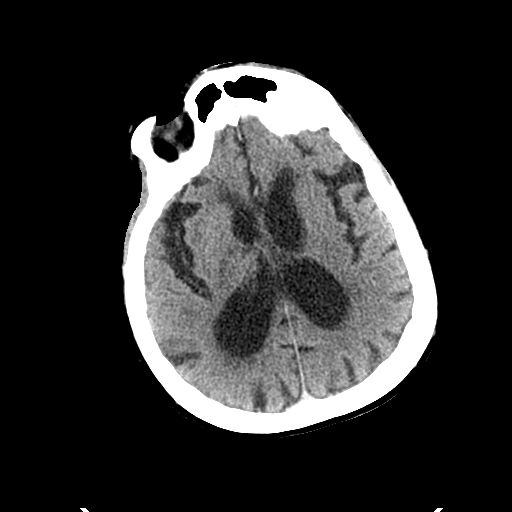
[im 21/33  brain]
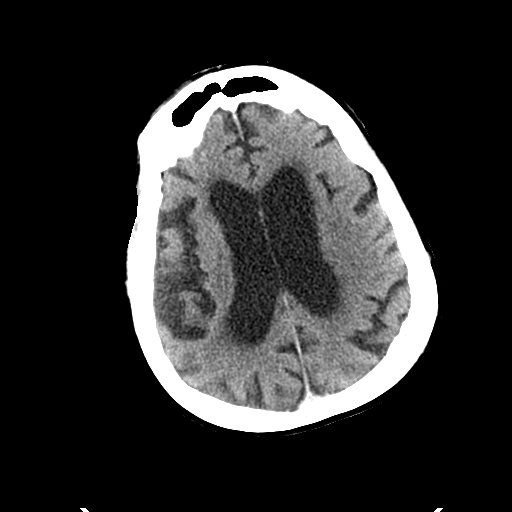
[im 21/33  bone]
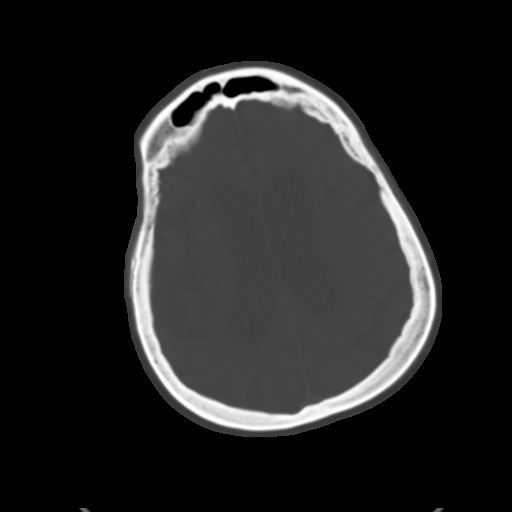
[im 23/33  brain]
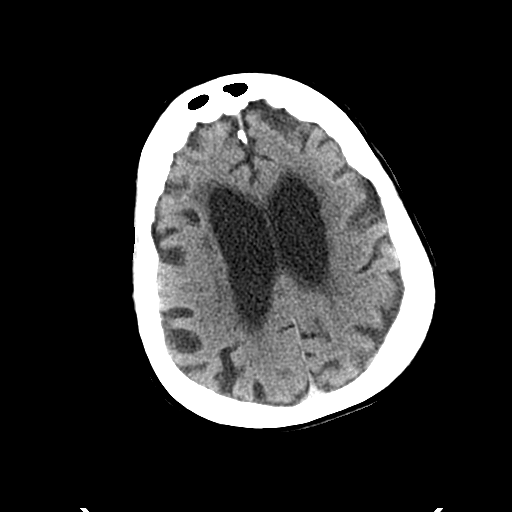
[im 26/33  brain]
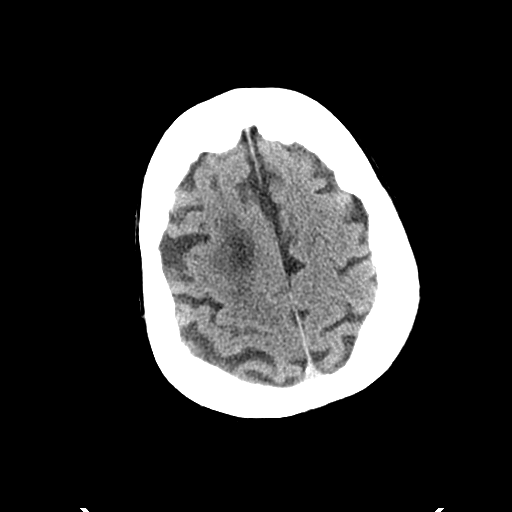
[im 28/33  brain]
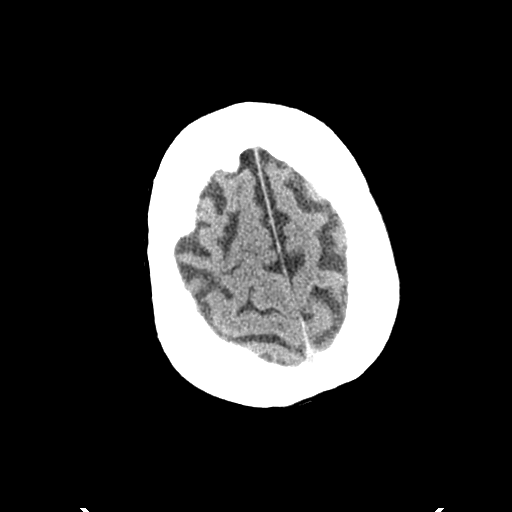
[im 30/33  brain]
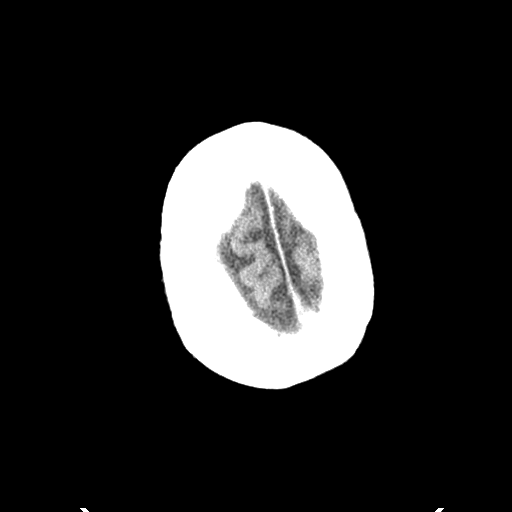
[im 30/33  bone]
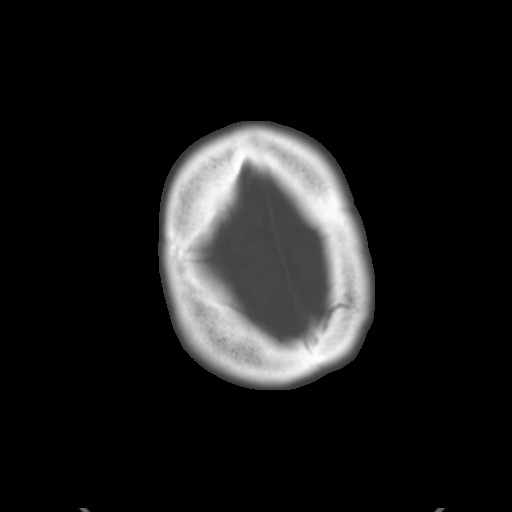

[Series 3: bone windows · axial · 0.48mm/px · z∈[+1380,+1426]mm · 3 of 33 slices shown]
[im 3/33  bone]
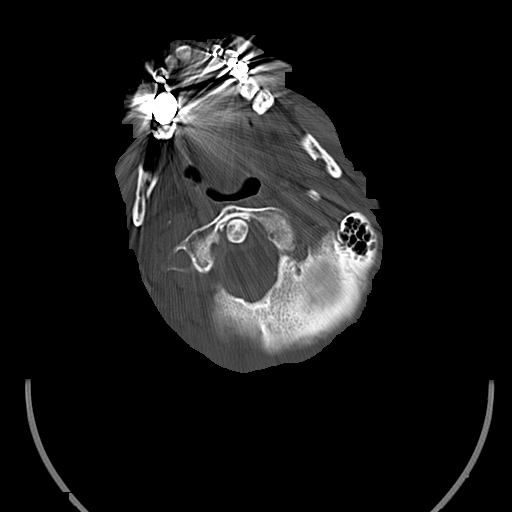
[im 7/33  bone]
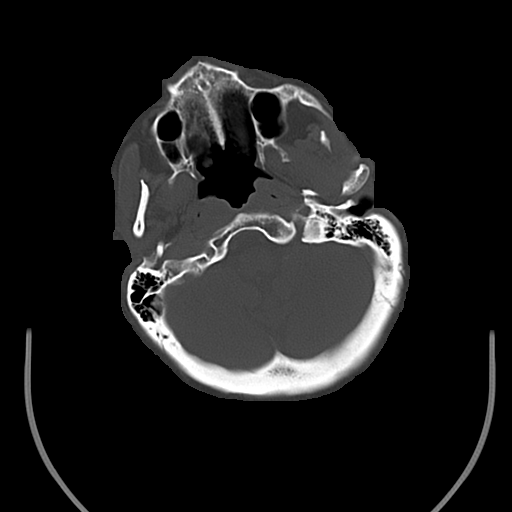
[im 12/33  bone]
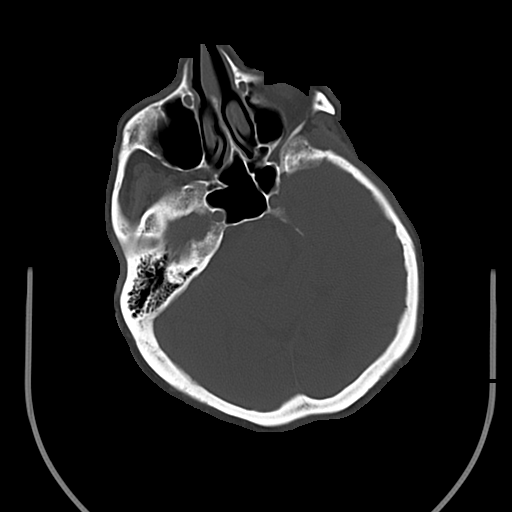

[16 of 30 positions shown; findings below may reference images not displayed]

FINDINGS: No mass lesion, mass effect, midline shift,
hydrocephalus, hemorrhage.  No acute territorial cortical
ischemia/infarct. Atrophy and chronic ischemic white matter disease
is present.
IMPRESSION: Atrophy and chronic ischemic white matter disease without acute
intracranial abnormality.
